# Patient Record
Sex: Female | Born: 1945
Health system: Southern US, Community
[De-identification: ages and names within clinical notes are randomized; demographics above are authoritative.]

## PROBLEM LIST (undated history)

## (undated) DIAGNOSIS — K2900 Acute gastritis without bleeding: Secondary | ICD-10-CM

## (undated) DIAGNOSIS — M1990 Unspecified osteoarthritis, unspecified site: Secondary | ICD-10-CM

## (undated) DIAGNOSIS — K648 Other hemorrhoids: Secondary | ICD-10-CM

## (undated) DIAGNOSIS — H409 Unspecified glaucoma: Secondary | ICD-10-CM

## (undated) DIAGNOSIS — K5730 Diverticulosis of large intestine without perforation or abscess without bleeding: Secondary | ICD-10-CM

## (undated) HISTORY — DX: Other hemorrhoids: K64.8

## (undated) HISTORY — PX: OTHER SURGICAL HISTORY: SHX169

## (undated) HISTORY — DX: Unspecified glaucoma: H40.9

## (undated) HISTORY — DX: Diverticulosis of large intestine without perforation or abscess without bleeding: K57.30

## (undated) HISTORY — PX: APPENDECTOMY: SHX54

## (undated) HISTORY — DX: Acute gastritis without bleeding: K29.00

## (undated) HISTORY — DX: Unspecified osteoarthritis, unspecified site: M19.90

---

## 2000-11-21 HISTORY — PX: COLONOSCOPY: SHX174

## 2000-11-21 HISTORY — PX: ESOPHAGOGASTRODUODENOSCOPY: SHX1529

## 2001-05-01 ENCOUNTER — Ambulatory Visit (HOSPITAL_COMMUNITY): Admission: RE | Admit: 2001-05-01 | Discharge: 2001-05-01 | Payer: Self-pay | Admitting: Internal Medicine

## 2001-05-01 ENCOUNTER — Encounter: Payer: Self-pay | Admitting: Internal Medicine

## 2001-05-11 ENCOUNTER — Encounter: Payer: Self-pay | Admitting: Internal Medicine

## 2001-05-11 ENCOUNTER — Ambulatory Visit (HOSPITAL_COMMUNITY): Admission: RE | Admit: 2001-05-11 | Discharge: 2001-05-11 | Payer: Self-pay | Admitting: Internal Medicine

## 2001-05-14 ENCOUNTER — Ambulatory Visit (HOSPITAL_COMMUNITY): Admission: RE | Admit: 2001-05-14 | Discharge: 2001-05-14 | Payer: Self-pay | Admitting: Internal Medicine

## 2001-05-14 ENCOUNTER — Encounter: Payer: Self-pay | Admitting: Internal Medicine

## 2001-05-19 ENCOUNTER — Encounter: Payer: Self-pay | Admitting: Internal Medicine

## 2001-05-19 ENCOUNTER — Encounter: Admission: RE | Admit: 2001-05-19 | Discharge: 2001-05-19 | Payer: Self-pay | Admitting: Internal Medicine

## 2001-05-28 ENCOUNTER — Encounter: Payer: Self-pay | Admitting: Internal Medicine

## 2001-05-28 ENCOUNTER — Ambulatory Visit (HOSPITAL_COMMUNITY): Admission: RE | Admit: 2001-05-28 | Discharge: 2001-05-28 | Payer: Self-pay | Admitting: Internal Medicine

## 2005-04-27 ENCOUNTER — Other Ambulatory Visit: Admission: RE | Admit: 2005-04-27 | Discharge: 2005-04-27 | Payer: Self-pay | Admitting: Obstetrics and Gynecology

## 2005-12-13 ENCOUNTER — Encounter: Admission: RE | Admit: 2005-12-13 | Discharge: 2005-12-13 | Payer: Self-pay | Admitting: Internal Medicine

## 2006-08-15 ENCOUNTER — Ambulatory Visit (HOSPITAL_COMMUNITY): Admission: RE | Admit: 2006-08-15 | Discharge: 2006-08-16 | Payer: Self-pay | Admitting: Orthopedic Surgery
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
## 2007-10-06 IMAGING — CR DG WRIST 2V*L*
1 series · 1 of 1 positions shown · non-contrast
Comparison: none

CLINICAL DATA: Fractured wrist.  Postop.
 LEFT WRIST - 2 VIEW:

[view not recorded]
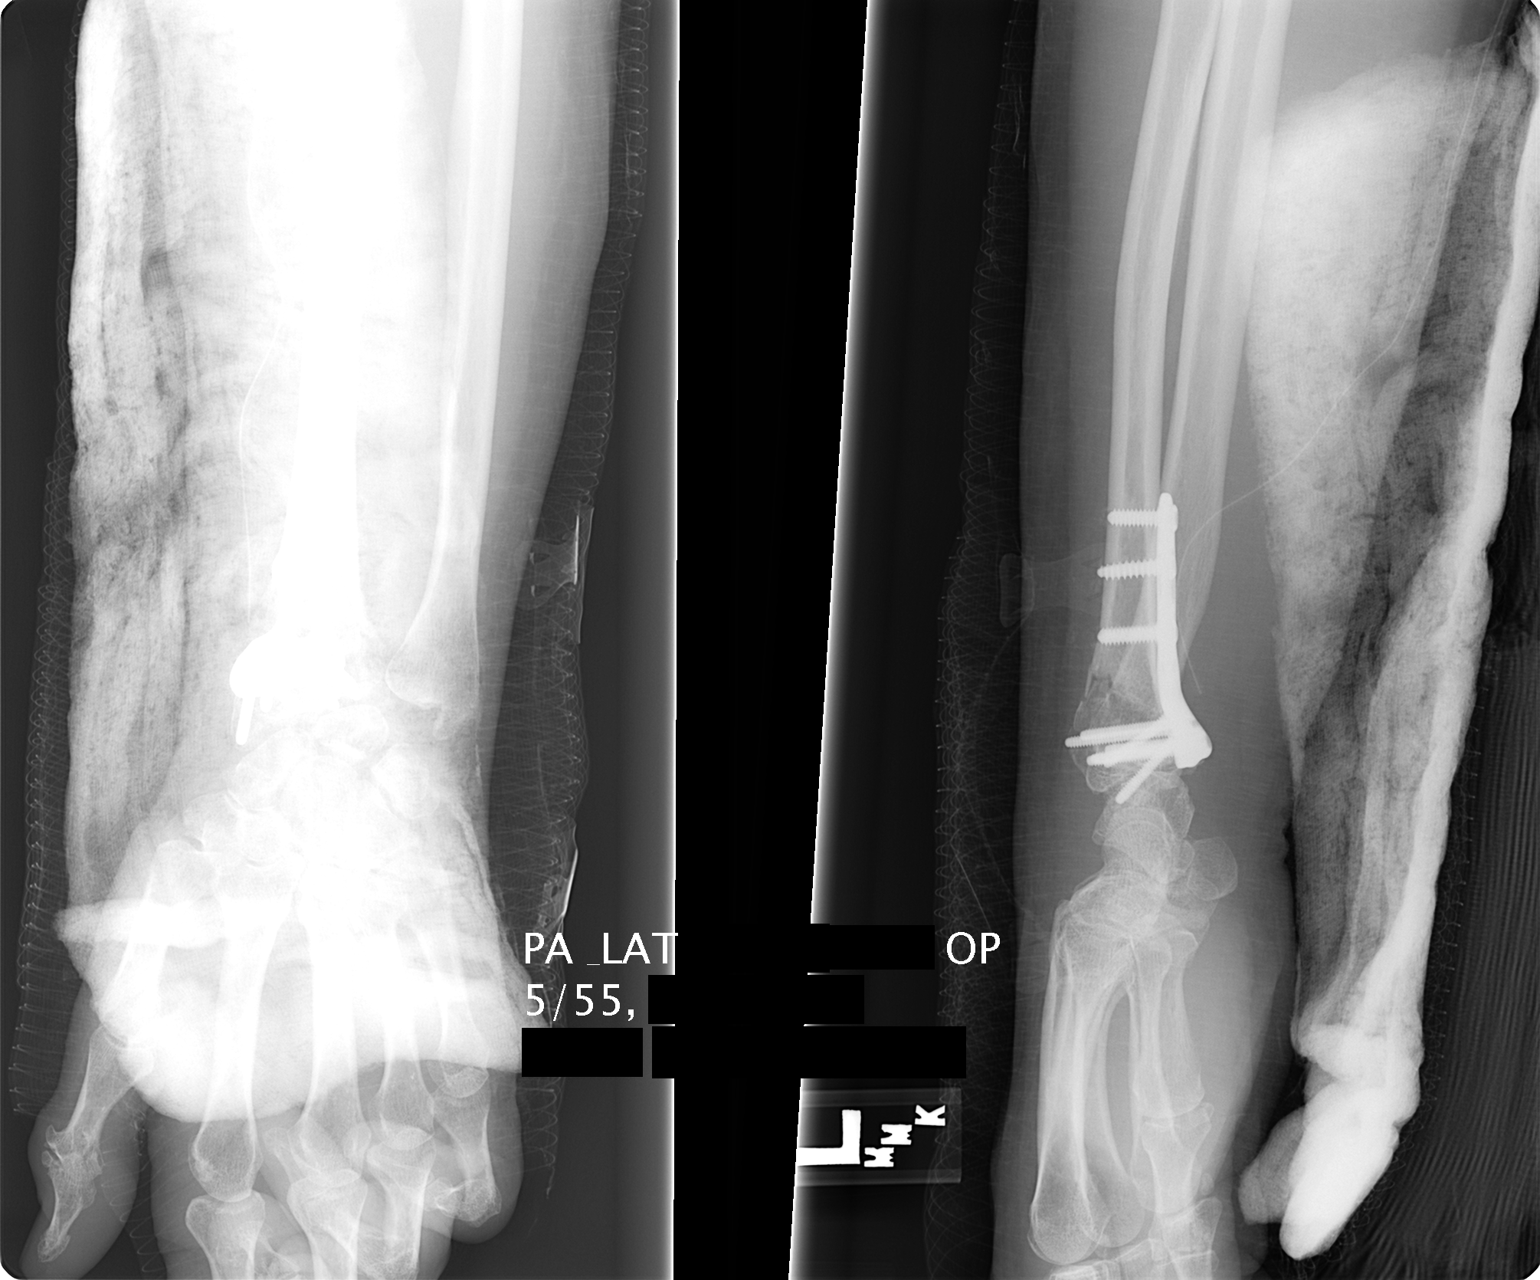

[1 of 1 positions shown; findings below may reference images not displayed]

FINDINGS: Two views of the left wrist show plate and screw fixation of the distal left radial fracture in good position and alignment.
IMPRESSION: Fixation of distal left radial fracture.

## 2010-12-12 ENCOUNTER — Encounter: Payer: Self-pay | Admitting: Obstetrics and Gynecology

## 2011-06-22 ENCOUNTER — Encounter: Payer: Self-pay | Admitting: Internal Medicine

## 2012-08-21 ENCOUNTER — Encounter: Payer: Self-pay | Admitting: Internal Medicine

## 2014-03-24 ENCOUNTER — Encounter: Payer: Self-pay | Admitting: *Deleted

## 2014-03-24 ENCOUNTER — Other Ambulatory Visit: Payer: Self-pay | Admitting: Internal Medicine

## 2014-03-26 ENCOUNTER — Encounter: Payer: Self-pay | Admitting: Nurse Practitioner

## 2014-03-26 ENCOUNTER — Ambulatory Visit (INDEPENDENT_AMBULATORY_CARE_PROVIDER_SITE_OTHER): Payer: Medicare HMO | Admitting: Nurse Practitioner

## 2014-03-26 VITALS — BP 122/80 | HR 72 | Ht 59.5 in | Wt 137.4 lb

## 2014-03-26 DIAGNOSIS — R634 Abnormal weight loss: Secondary | ICD-10-CM

## 2014-03-26 DIAGNOSIS — R110 Nausea: Secondary | ICD-10-CM

## 2014-03-26 DIAGNOSIS — Z1211 Encounter for screening for malignant neoplasm of colon: Secondary | ICD-10-CM

## 2014-03-26 DIAGNOSIS — R112 Nausea with vomiting, unspecified: Secondary | ICD-10-CM

## 2014-03-26 DIAGNOSIS — R1011 Right upper quadrant pain: Secondary | ICD-10-CM

## 2014-03-26 DIAGNOSIS — R109 Unspecified abdominal pain: Secondary | ICD-10-CM

## 2014-03-26 MED ORDER — MOVIPREP 100 G PO SOLR: 1.0000 | Freq: Once | ORAL | Status: DC

## 2014-03-26 NOTE — Progress Notes (Signed)
    HPI :  Patient is a 68 year old female known remotely to Dr. Marina GoodellPerry. She had a colonoscopy in 2002 for evaluation of abdominal pain/bloating and weight loss. Findings included internal hemorrhoids and diverticulosis. Patient is overdue for repeat screening colonoscopy which she comes today to discuss.   In addition to above,  patient is here for evaluation of RUQ pain which started in December. The pain radiates around to her right mid back and is associated with intermittent nausea and vomiting. Pain is worse after meals, better when up and moving around. Lying down, especially on lying on right side exacerbates the pain. Patient was started on Prilosec 2 weeks ago, so far no improvement. She has had an unintentional weight loss of 10 pounds since December. Recent labs by PCP were unremarkable. WBC 10.8, hemoglobin 13.1, LFTs normal. She had an abdominal ultrasound which was also normal. Yesterday a CT scan of the abdomen and pelvis with contrast was unremarkable as well. BMs normal. Patient does take 3 Ibuprofen tablets about twice a week.  Past Medical History  Diagnosis Date  . Diverticulosis of colon (without mention of hemorrhage)   . Internal hemorrhoids without mention of complication   . Acute gastritis without mention of hemorrhage   . Glaucoma    FMH: pancreatic cancer, colon polyps (mother) and diabetes  History  Substance Use Topics  . Smoking status: Never Smoker   . Smokeless tobacco: Not on file  . Alcohol Use: No   Current Outpatient Prescriptions  Medication Sig Dispense Refill  . ketotifen (ZADITOR) 0.025 % ophthalmic solution Place 1 drop into both eyes 2 (two) times daily.       No current facility-administered medications for this visit.   Allergies not on file Review of Systems: Positive for night sweats and urine leakage. All other systems reviewed and negative except where noted in HPI.   Physical Exam: BP 122/80  Pulse 72  Ht 4' 11.5" (1.511 m)  Wt 137  lb 6.4 oz (62.324 kg)  BMI 27.30 kg/m2 Constitutional: Pleasant,well-developed, white female in no acute distress. HEENT: Normocephalic and atraumatic. Conjunctivae are normal. No scleral icterus. Neck supple.  Cardiovascular: Normal rate, regular rhythm.  Pulmonary/chest: Effort normal and breath sounds normal. No wheezing, rales or rhonchi. Abdominal: Soft, nondistended, nontender. Bowel sounds active throughout. There are no masses palpable. No hepatomegaly. Extremities: no edema Lymphadenopathy: No cervical adenopathy noted. Neurological: Alert and oriented to person place and time. Skin: Skin is warm and dry. No rashes noted. Psychiatric: Normal mood and affect. Behavior is normal.   ASSESSMENT AND PLAN:  281. 68 year old female with several month history of progressive RUQ pain associated with nausea, vomiting and weight loss. Pain worse with meals. Labs, U/S and CTscan non-diagnostic. She does take NSAIDS so PUD is one possibility. For further evaluation patient will have EGD at time of screening colonoscopy. The benefits, risks, and potential complications of EGD with possible biopsies were discussed with the patient and she agrees to proceed.   2. Colon cancer screening. Last colonoscopy was 2002. The risks, benefits, and alternatives to colonoscopy with possible biopsy and possible polypectomy were discussed with the patient and she consents to proceed.

## 2014-03-26 NOTE — Patient Instructions (Signed)

## 2014-03-26 NOTE — Progress Notes (Signed)
Reviewed and Agree.

## 2014-03-31 ENCOUNTER — Encounter: Payer: Self-pay | Admitting: Internal Medicine

## 2014-04-11 ENCOUNTER — Encounter: Payer: Self-pay | Admitting: Internal Medicine

## 2014-05-01 ENCOUNTER — Encounter: Payer: Self-pay | Admitting: Internal Medicine

## 2014-05-01 ENCOUNTER — Ambulatory Visit (AMBULATORY_SURGERY_CENTER): Payer: Medicare HMO | Admitting: Internal Medicine

## 2014-05-01 VITALS — BP 126/75 | HR 60 | Temp 98.1°F | Resp 14 | Ht 59.0 in | Wt 137.0 lb

## 2014-05-01 DIAGNOSIS — R109 Unspecified abdominal pain: Secondary | ICD-10-CM

## 2014-05-01 DIAGNOSIS — R1011 Right upper quadrant pain: Secondary | ICD-10-CM

## 2014-05-01 DIAGNOSIS — Z1211 Encounter for screening for malignant neoplasm of colon: Secondary | ICD-10-CM

## 2014-05-01 MED ORDER — SODIUM CHLORIDE 0.9 % IV SOLN: 500.0000 mL | INTRAVENOUS | Status: DC

## 2014-05-01 NOTE — Op Note (Signed)
[facility] [address] [place], [address]   ENDOSCOPY PROCEDURE REPORT  PATIENT: Cheyenne Clark, Cheyenne Clark  MR#: [id number] BIRTHDATE: 02-17-1946 , 67  yrs. old GENDER: Female ENDOSCOPIST: Roxy Cedar, MD REFERRED BY:  Geoffry Paradise, M.D. PROCEDURE DATE:  05/01/2014 PROCEDURE:  EGD, diagnostic ASA CLASS:     Class II INDICATIONS:  abdominal pain in the upper right quadrant. MEDICATIONS: MAC sedation, administered by CRNA and propofol (Diprivan) 30mg  IV TOPICAL ANESTHETIC: none  DESCRIPTION OF PROCEDURE: After the risks benefits and alternatives of the procedure were thoroughly explained, informed consent was obtained.  The LB [id number] endoscope was introduced through the mouth and advanced to the second portion of the duodenum. Without limitations.  The instrument was slowly withdrawn as the mucosa was fully examined.     EXAM:The upper, middle and distal third of the esophagus were carefully inspected and no abnormalities were noted.  The z-line was well seen at the GEJ.  The endoscope was pushed into the fundus which was normal including a retroflexed view.  The antrum, gastric body, first and second part of the duodenum were unremarkable. Retroflexed views revealed no abnormalities.     The scope was then withdrawn from the patient and the procedure completed.  COMPLICATIONS: There were no complications. ENDOSCOPIC IMPRESSION: 1. Normal EGD 2. No GI cause for her upper quadrant pain found or suspected. May be musculoskeletal  RECOMMENDATIONS: 1. GI OP follow-up is advised on a PRN basis.. Return to the care of Dr. Jacky Kindle  REPEAT EXAM:  eSigned:  Roxy Cedar, MD 05/01/2014 4:01 PM   [id number] Jacky Kindle, MD and The Patient

## 2014-05-01 NOTE — Op Note (Signed)
[facility] [address] [place], [address]   COLONOSCOPY PROCEDURE REPORT  PATIENT: Cheyenne Clark, Cheyenne Clark  MR#: [id number] BIRTHDATE: 11/15/1946 , 67  yrs. old GENDER: Female ENDOSCOPIST: Roxy Cedar, MD REFERRED [id number] Jacky Kindle, M.D. PROCEDURE DATE:  05/01/2014 PROCEDURE:   Colonoscopy, screening First Screening Colonoscopy - Avg.  risk and is 50 yrs.  old or older - No.  Prior Negative Screening - Now for repeat screening. 10 or more years since last screening  History of Adenoma - Now for follow-up colonoscopy & has been > or = to 3 yrs.  N/A  Polyps Removed Today? No.  Recommend repeat exam, <10 yrs? No. ASA CLASS:   Class II INDICATIONS:average risk screening.. Normal index exam 2002 MEDICATIONS: MAC sedation, administered by CRNA and propofol (Diprivan) 350mg  IV  DESCRIPTION OF PROCEDURE:   After the risks benefits and alternatives of the procedure were thoroughly explained, informed consent was obtained.  A digital rectal exam revealed no abnormalities of the rectum.   The LB [id number]  endoscope was introduced through the anus and advanced to the cecum, which was identified by both the appendix and ileocecal valve. No adverse events experienced.   The quality of the prep was excellent, using MoviPrep  The instrument was then slowly withdrawn as the colon was fully examined.  COLON FINDINGS: The mucosa appeared normal in the terminal ileum. Mild diverticulosis was noted in the sigmoid colon.   The colon mucosa was otherwise normal.  Retroflexed views revealed internal hemorrhoids. The time to cecum=4 minutes 34 seconds.  Withdrawal time=11 minutes 07 seconds.  The scope was withdrawn and the procedure completed. COMPLICATIONS: There were no complications.  ENDOSCOPIC IMPRESSION: 1.   Normal mucosa in the terminal ileum 2.   Mild diverticulosis was noted in the sigmoid colon 3.   The colon mucosa was otherwise  normal  RECOMMENDATIONS: 1.  Continue current colorectal screening recommendations for "routine risk" patients with a repeat colonoscopy in 10 years. 2.  Upper endoscopy today (see report)   eSigned:  Roxy Cedar, MD 05/01/2014 3:59 PM   cc: Geoffry Paradise, MD and The Patient

## 2014-05-01 NOTE — Patient Instructions (Signed)
YOU HAD AN ENDOSCOPIC PROCEDURE TODAY AT THE [facility]: Refer to the procedure report that was given to you for any specific questions about what was found during the examination.  If the procedure report does not answer your questions, please call your gastroenterologist to clarify.  If you requested that your care partner not be given the details of your procedure findings, then the procedure report has been included in a sealed envelope for you to review at your convenience later.  YOU SHOULD EXPECT: Some feelings of bloating in the abdomen. Passage of more gas than usual.  Walking can help get rid of the air that was put into your GI tract during the procedure and reduce the bloating. If you had a lower endoscopy (such as a colonoscopy or flexible sigmoidoscopy) you may notice spotting of blood in your stool or on the toilet paper. If you underwent a bowel prep for your procedure, then you may not have a normal bowel movement for a few days.  DIET: Your first meal following the procedure should be a light meal and then it is ok to progress to your normal diet.  A half-sandwich or bowl of soup is an example of a good first meal.  Heavy or fried foods are harder to digest and may make you feel nauseous or bloated.  Likewise meals heavy in dairy and vegetables can cause extra gas to form and this can also increase the bloating.  Drink plenty of fluids but you should avoid alcoholic beverages for 24 hours.  ACTIVITY: Your care partner should take you home directly after the procedure.  You should plan to take it easy, moving slowly for the rest of the day.  You can resume normal activity the day after the procedure however you should NOT DRIVE or use heavy machinery for 24 hours (because of the sedation medicines used during the test).    SYMPTOMS TO REPORT IMMEDIATELY: A gastroenterologist can be reached at any hour.  During normal business hours, 8:30 AM to 5:00 PM Monday through Friday,  call [phone].  After hours and on weekends, please call the GI answering service at [phone] who will take a message and have the physician on call contact you.   Following lower endoscopy (colonoscopy or flexible sigmoidoscopy):  Excessive amounts of blood in the stool  Significant tenderness or worsening of abdominal pains  Swelling of the abdomen that is new, acute  Fever of 100F or higher  Following upper endoscopy (EGD)  Vomiting of blood or coffee ground material  New chest pain or pain under the shoulder blades  Painful or persistently difficult swallowing  New shortness of breath  Fever of 100F or higher  Black, tarry-looking stools  FOLLOW UP: If any biopsies were taken you will be contacted by phone or by letter within the next 1-3 weeks.  Call your gastroenterologist if you have not heard about the biopsies in 3 weeks.  Our staff will call the home number listed on your records the next business day following your procedure to check on you and address any questions or concerns that you may have at that time regarding the information given to you following your procedure. This is a courtesy call and so if there is no answer at the home number and we have not heard from you through the emergency physician on call, we will assume that you have returned to your regular daily activities without incident.  SIGNATURES/CONFIDENTIALITY: You and/or your care   partner have signed paperwork which will be entered into your electronic medical record.  These signatures attest to the fact that that the information above on your After Visit Summary has been reviewed and is understood.  Full responsibility of the confidentiality of this discharge information lies with you and/or your care-partner.  

## 2014-05-01 NOTE — Progress Notes (Signed)
Report to PACU, RN, vss, BBS= Clear.  

## 2014-05-02 ENCOUNTER — Telehealth: Payer: Self-pay

## 2014-05-02 NOTE — Telephone Encounter (Signed)
  Follow up Call-  Call back number 05/01/2014  Post procedure Call Back phone  # [phone]  Permission to leave phone message Yes     Patient questions:  Do you have a fever, pain , or abdominal swelling? no Pain Score  0 *  Have you tolerated food without any problems? yes  Have you been able to return to your normal activities? yes  Do you have any questions about your discharge instructions: Diet   no Medications  no Follow up visit  no  Do you have questions or concerns about your Care? no  Actions: * If pain score is 4 or above: No action needed, pain <4.

## 2014-05-09 ENCOUNTER — Other Ambulatory Visit (HOSPITAL_COMMUNITY): Payer: Self-pay | Admitting: Internal Medicine

## 2014-05-09 DIAGNOSIS — R110 Nausea: Secondary | ICD-10-CM

## 2014-05-09 DIAGNOSIS — R1011 Right upper quadrant pain: Secondary | ICD-10-CM

## 2014-05-12 ENCOUNTER — Ambulatory Visit (HOSPITAL_COMMUNITY)
Admission: RE | Admit: 2014-05-12 | Discharge: 2014-05-12 | Disposition: A | Discharge status: Home / Self Care | Payer: Medicare HMO | Source: Ambulatory Visit | Attending: Internal Medicine | Admitting: Internal Medicine

## 2014-05-12 DIAGNOSIS — R110 Nausea: Secondary | ICD-10-CM

## 2014-05-12 DIAGNOSIS — R1011 Right upper quadrant pain: Secondary | ICD-10-CM

## 2014-05-12 DIAGNOSIS — R112 Nausea with vomiting, unspecified: Secondary | ICD-10-CM | POA: Insufficient documentation

## 2014-05-12 DIAGNOSIS — M549 Dorsalgia, unspecified: Secondary | ICD-10-CM | POA: Insufficient documentation

## 2014-05-12 MED ORDER — TECHNETIUM TC 99M MEBROFENIN IV KIT
5.5000 | PACK | Freq: Once | INTRAVENOUS | Status: AC | PRN
  Administered 2014-05-12: 6 via INTRAVENOUS

## 2014-06-11 ENCOUNTER — Telehealth (INDEPENDENT_AMBULATORY_CARE_PROVIDER_SITE_OTHER): Payer: Self-pay

## 2014-06-11 NOTE — Telephone Encounter (Signed)
Per Dr Ardine EngGerkin's request appt made for next week ov to eval gb. LMOM for pt to call and confirm to arrive at 11:30am on 7-29.

## 2014-06-18 ENCOUNTER — Encounter (INDEPENDENT_AMBULATORY_CARE_PROVIDER_SITE_OTHER): Payer: Self-pay | Admitting: Surgery

## 2014-06-18 ENCOUNTER — Ambulatory Visit: Payer: Medicare HMO | Admitting: Internal Medicine

## 2014-06-18 ENCOUNTER — Ambulatory Visit (INDEPENDENT_AMBULATORY_CARE_PROVIDER_SITE_OTHER): Payer: Medicare HMO | Admitting: Surgery

## 2014-06-18 VITALS — BP 132/86 | HR 60 | Temp 98.9°F | Resp 12 | Ht 59.0 in | Wt 129.4 lb

## 2014-06-18 DIAGNOSIS — R1011 Right upper quadrant pain: Secondary | ICD-10-CM

## 2014-06-18 DIAGNOSIS — R1115 Cyclical vomiting syndrome unrelated to migraine: Secondary | ICD-10-CM

## 2014-06-18 NOTE — Progress Notes (Signed)
General Surgery [facility]  Chief Complaint  Patient presents with  . New Evaluation    abdominal pain - referral from Dr. Geoffry Paradise    HISTORY: Patient is a 68 year old female referred by her primary care physician for evaluation of right upper quadrant abdominal pain radiating to the back associated with nausea. Patient has been symptomatic since December 2014. Symptoms markedly intensified in March of 2015. She has persistent nausea. Right upper quadrant abdominal pain radiating to the back is worse at night. She has rare episodes of emesis. Patient denies fevers or chills. She denies jaundice or acholic stools.  Family history is notable for cholecystectomy in the patient's mother.  Only prior abdominal surgery his appendectomy performed in the 1960s.  Patient has had an extensive evaluation including abdominal ultrasound which was normal. Abdominal CT scan which was normal. Nuclear medicine hepatobiliary scan which showed a normal ejection fraction but the patient did experience symptoms with administration of oral Ensure to drink.  Patient has been evaluated by Dr. Yancey Flemings from gastroenterology and has a normal EGD and a normal colonoscopy.  Past Medical History  Diagnosis Date  . Diverticulosis of colon (without mention of hemorrhage)   . Internal hemorrhoids without mention of complication   . Acute gastritis without mention of hemorrhage   . Glaucoma   . Arthritis     Current Outpatient Prescriptions  Medication Sig Dispense Refill  . latanoprost (XALATAN) 0.005 % ophthalmic solution       . omeprazole (PRILOSEC) 40 MG capsule        No current facility-administered medications for this visit.    No Known Allergies  Family History  Problem Relation Age of Onset  . Colon cancer Neg Hx   . Pancreatic cancer Father   . Cancer Father     pancreatic  . Melanoma Maternal Grandfather   . Leukemia Paternal Grandfather     History    Social History  . Marital Status: Married    Spouse Name: N/A    Number of Children: N/A  . Years of Education: N/A   Social History Main Topics  . Smoking status: Never Smoker   . Smokeless tobacco: Never Used  . Alcohol Use: No  . Drug Use: No  . Sexual Activity: None   Other Topics Concern  . None   Social History Narrative  . None    REVIEW OF SYSTEMS - PERTINENT POSITIVES ONLY: Right upper quadrant abdominal pain radiating to the back, persistent nausea, weight loss of 20 pounds.  EXAM: Filed Vitals:   06/18/14 1130  BP: 132/86  Pulse: 60  Temp: 98.9 F (37.2 C)  Resp: 12    GENERAL: well-developed, well-nourished, no acute distress HEENT: normocephalic; pupils equal and reactive; sclerae clear; dentition good; mucous membranes moist NECK:  No palpable masses in the thyroid bed; symmetric on extension; no palpable anterior or posterior cervical lymphadenopathy; no supraclavicular masses; no tenderness CHEST: clear to auscultation bilaterally without rales, rhonchi, or wheezes CARDIAC: regular rate and rhythm without significant murmur; peripheral pulses are full ABDOMEN: soft without distension; bowel sounds present; no mass; no hepatosplenomegaly; no hernia EXT:  non-tender without edema; no deformity NEURO: no gross focal deficits; no sign of tremor   LABORATORY RESULTS: See [facility] (CHL-Epic) for most recent results  RADIOLOGY RESULTS: See [facility] (CHL-Epic) for most recent results  IMPRESSION: Right upper quadrant abdominal pain with nausea of undetermined etiology  PLAN: I had a lengthy discussion today  with the patient and her husband. We reviewed all of the above studies. I provided him with written literature on cholecystectomy to review at home.  After careful consideration of the patient's clinical course, I have recommended laparoscopic cholecystectomy with intraoperative cholangiography. We have discussed the procedure, the  potential complications, the hospital stay to be anticipated, and her recovery. I believe there is approximately a 60-70% chance of symptomatic improvement. Patient and her husband understand and wish to proceed with cholecystectomy in the near future.  The risks and benefits of the procedure have been discussed at length with the patient.  The patient understands the proposed procedure, potential alternative treatments, and the course of recovery to be expected.  All of the patient's questions have been answered at this time.  The patient wishes to proceed with surgery.  Velora Hecklerodd M. Claryssa Sandner, MD, FACS General & Endocrine Surgery [facility]  Primary Care Physician: Minda MeoARONSON,RICHARD A, MD

## 2014-06-18 NOTE — Patient Instructions (Signed)
  [facility]  LAPAROSCOPIC SURGERY - POST-OP INSTRUCTIONS  Always review your discharge instruction sheet given to you by the facility where your surgery was performed.  A prescription for pain medication may be given to you upon discharge.  Take your pain medication as prescribed.  If narcotic pain medicine is not needed, then you may take acetaminophen (Tylenol) or ibuprofen (Advil) as needed.  Take your usually prescribed medications unless otherwise directed.  If you need a refill on your pain medication, please contact your pharmacy.  They will contact our office to request authorization. Prescriptions will not be filled after 5 P.M. or on weekends.  You should follow a light diet the first few days after arrival home, such as soup and crackers or toast.  Be sure to include plenty of fluids daily.  Most patients will experience some swelling and bruising in the area of the incisions.  Ice packs will help.  Swelling and bruising can take several days to resolve.   It is common to experience some constipation if taking pain medication after surgery.  Increasing fluid intake and taking a stool softener (such as Colace) will usually help or prevent this problem from occurring.  A mild laxative (Milk of Magnesia or Miralax) should be taken according to package instructions if there are no bowel movements after 48 hours.  Unless discharge instructions indicate otherwise, you may remove your bandages 24-48 hours after surgery, and you may shower at that time.  You may have steri-strips (small skin tapes) in place directly over the incision.  These strips should be left on the skin for 7-10 days.  If your surgeon used skin glue on the incision, you may shower in 24 hours.  The glue will flake off over the next 2-3 weeks.  Any sutures or staples will be removed at the office during your follow-up visit.  ACTIVITIES:  You may resume regular (light) daily activities beginning the  next day-such as daily self-care, walking, climbing stairs-gradually increasing activities as tolerated.  You may have sexual intercourse when it is comfortable.  Refrain from any heavy lifting or straining until approved by your doctor.  You may drive when you are no longer taking prescription pain medication, you can comfortably wear a seatbelt, and you can safely maneuver your car and apply brakes.  You should see your doctor in the office for a follow-up appointment approximately 2-3 weeks after your surgery.  Make sure that you call for this appointment within a day or two after you arrive home to insure a convenient appointment time.  WHEN TO CALL YOUR DOCTOR: 1. Fever over 101.0 2. Inability to urinate 3. Continued bleeding from incision 4. Increased pain, redness, or drainage from the incision 5. Increasing abdominal pain  The clinic staff is available to answer your questions during regular business hours.  Please don't hesitate to call and ask to speak to one of the nurses for clinical concerns.  If you have a medical emergency, go to the nearest emergency room or call 911.  A surgeon from [facility] is always on call for the hospital.  Jamiya Nims M. Marylynn Rigdon, MD, FACS [facility] Office: [phone] Toll Free:  [phone] FAX [phone]  Web site: [email or web address] 

## 2014-07-03 ENCOUNTER — Encounter (HOSPITAL_COMMUNITY): Payer: Self-pay | Admitting: Pharmacy Technician

## 2014-07-07 NOTE — Patient Instructions (Signed)
Cheyenne SartoriusLinda E Clark  07/07/2014   Your procedure is scheduled on: 07/11/2014     Report to [facility] Main [facility]tr[facility].  Follow the Signs to Short Stay Center at    0545     am  Call this number if you have problems the morning of surgery: [phone]   Remember:   Do not eat food or drink liquids after midnight.   Take these medicines the morning of surgery with A SIP OF WATER:    Do not wear jewelry, make-up or nail polish.  Do not wear lotions, powders, or perfumes. , deodorant   Do not shave 48 hours prior to surgery.   Do not bring valuables to the hospital.  Contacts, dentures or bridgework may not be worn into surgery.  Leave suitcase in the car. After surgery it may be brought to your room.  For patients admitted to the hospital, checkout time is 11:00 AM the day of  discharge.         Please read over the following fact sheets that you were given: [facility] - Preparing for Surgery Before surgery, you can play an important role.  Because skin is not sterile, your skin needs to be as free of germs as possible.  You can reduce the number of germs on your skin by washing with CHG (chlorahexidine gluconate) soap before surgery.  CHG is an antiseptic cleaner which kills germs and bonds with the skin to continue killing germs even after washing. Please DO NOT use if you have an allergy to CHG or antibacterial soaps.  If your skin becomes reddened/irritated stop using the CHG and inform your nurse when you arrive at Short Stay. Do not shave (including legs and underarms) for at least 48 hours prior to the first CHG shower.  You may shave your face/neck. Please follow these instructions carefully:  1.  Shower with CHG Soap the night before surgery and the  morning of Surgery.  2.  If you choose to wash your hair, wash your hair first as usual with your  normal  shampoo.  3.  After you shampoo, rinse your hair and body thoroughly to remove the  shampoo.                           4.  Use CHG as  you would any other liquid soap.  You can apply chg directly  to the skin and wash                       Gently with a scrungie or clean washcloth.  5.  Apply the CHG Soap to your body ONLY FROM THE NECK DOWN.   Do not use on face/ open                           Wound or open sores. Avoid contact with eyes, ears mouth and genitals (private parts).                       Wash face,  Genitals (private parts) with your normal soap.             6.  Wash thoroughly, paying special attention to the area where your surgery  will be performed.  7.  Thoroughly rinse your body with warm water from the neck down.  8.  DO NOT shower/wash with your  normal soap after using and rinsing off  the CHG Soap.                9.  Pat yourself dry with a clean towel.            10.  Wear clean pajamas.            11.  Place clean sheets on your bed the night of your first shower and do not  sleep with pets. Day of Surgery : Do not apply any lotions/deodorants the morning of surgery.  Please wear clean clothes to the hospital/surgery center.  FAILURE TO FOLLOW THESE INSTRUCTIONS MAY RESULT IN THE CANCELLATION OF YOUR SURGERY PATIENT SIGNATURE_________________________________  NURSE SIGNATURE__________________________________  ________________________________________________________________________ , coughing and deep breathing exercises, leg exercises

## 2014-07-08 ENCOUNTER — Encounter (HOSPITAL_COMMUNITY)
Admission: RE | Admit: 2014-07-08 | Discharge: 2014-07-08 | Disposition: A | Discharge status: Home / Self Care | Payer: Medicare HMO | Source: Ambulatory Visit | Attending: Surgery | Admitting: Surgery

## 2014-07-08 ENCOUNTER — Encounter (HOSPITAL_COMMUNITY): Payer: Self-pay

## 2014-07-08 DIAGNOSIS — R1011 Right upper quadrant pain: Secondary | ICD-10-CM | POA: Diagnosis present

## 2014-07-08 DIAGNOSIS — Z79899 Other long term (current) drug therapy: Secondary | ICD-10-CM | POA: Diagnosis not present

## 2014-07-08 DIAGNOSIS — K8010 Calculus of gallbladder with chronic cholecystitis without obstruction: Secondary | ICD-10-CM | POA: Diagnosis not present

## 2014-07-08 LAB — CBC
HCT: 37.1 % (ref 36.0–46.0)
Hemoglobin: 12.7 g/dL (ref 12.0–15.0)
MCH: 32 pg (ref 26.0–34.0)
MCHC: 34.2 g/dL (ref 30.0–36.0)
MCV: 93.5 fL (ref 78.0–100.0)
Platelets: 290 10*3/uL (ref 150–400)
RBC: 3.97 MIL/uL (ref 3.87–5.11)
RDW: 12.5 % (ref 11.5–15.5)
WBC: 5.8 10*3/uL (ref 4.0–10.5)

## 2014-07-08 NOTE — Progress Notes (Signed)
Quick Note:  These results are acceptable for scheduled surgery.  Liliana Brentlinger M. Camilla Skeen, MD, FACS [facility] Office: [phone]   ______ 

## 2014-07-11 ENCOUNTER — Ambulatory Visit (HOSPITAL_COMMUNITY): Payer: Medicare HMO | Admitting: Certified Registered Nurse Anesthetist

## 2014-07-11 ENCOUNTER — Encounter (HOSPITAL_COMMUNITY)
Admission: RE | Disposition: A | Discharge status: Home / Self Care | Payer: Self-pay | Source: Ambulatory Visit | Attending: Surgery

## 2014-07-11 ENCOUNTER — Ambulatory Visit (HOSPITAL_COMMUNITY): Payer: Medicare HMO

## 2014-07-11 ENCOUNTER — Encounter (HOSPITAL_COMMUNITY): Payer: Medicare HMO | Admitting: Certified Registered Nurse Anesthetist

## 2014-07-11 ENCOUNTER — Observation Stay (HOSPITAL_COMMUNITY)
Admission: RE | Admit: 2014-07-11 | Discharge: 2014-07-11 | Disposition: A | Discharge status: Home / Self Care | Payer: Medicare HMO | Source: Ambulatory Visit | Attending: Surgery | Admitting: Surgery

## 2014-07-11 ENCOUNTER — Encounter (HOSPITAL_COMMUNITY): Payer: Self-pay | Admitting: *Deleted

## 2014-07-11 DIAGNOSIS — R112 Nausea with vomiting, unspecified: Secondary | ICD-10-CM | POA: Diagnosis present

## 2014-07-11 DIAGNOSIS — R1011 Right upper quadrant pain: Secondary | ICD-10-CM

## 2014-07-11 DIAGNOSIS — K828 Other specified diseases of gallbladder: Secondary | ICD-10-CM | POA: Diagnosis present

## 2014-07-11 DIAGNOSIS — K8010 Calculus of gallbladder with chronic cholecystitis without obstruction: Secondary | ICD-10-CM

## 2014-07-11 DIAGNOSIS — Z79899 Other long term (current) drug therapy: Secondary | ICD-10-CM | POA: Diagnosis not present

## 2014-07-11 HISTORY — PX: CHOLECYSTECTOMY: SHX55

## 2014-07-11 SURGERY — LAPAROSCOPIC CHOLECYSTECTOMY WITH INTRAOPERATIVE CHOLANGIOGRAM
Anesthesia: General | Site: Abdomen

## 2014-07-11 MED ORDER — KCL IN DEXTROSE-NACL 30-5-0.45 MEQ/L-%-% IV SOLN
INTRAVENOUS | Status: DC
  Administered 2014-07-11: 11:00:00 via INTRAVENOUS
  Filled 2014-07-11: qty 1000

## 2014-07-11 MED ORDER — LACTATED RINGERS IR SOLN
Status: DC | PRN
  Administered 2014-07-11: 1000 mL

## 2014-07-11 MED ORDER — LATANOPROST 0.005 % OP SOLN
1.0000 [drp] | Freq: Every day | OPHTHALMIC | Status: DC
  Filled 2014-07-11: qty 2.5

## 2014-07-11 MED ORDER — DEXAMETHASONE SODIUM PHOSPHATE 10 MG/ML IJ SOLN
INTRAMUSCULAR | Status: DC | PRN
  Administered 2014-07-11: 10 mg via INTRAVENOUS

## 2014-07-11 MED ORDER — LACTATED RINGERS IV SOLN
INTRAVENOUS | Status: DC
  Administered 2014-07-11: 10:00:00 via INTRAVENOUS

## 2014-07-11 MED ORDER — MEPERIDINE HCL 50 MG/ML IJ SOLN: 6.2500 mg | INTRAMUSCULAR | Status: DC | PRN

## 2014-07-11 MED ORDER — EPHEDRINE SULFATE 50 MG/ML IJ SOLN
INTRAMUSCULAR | Status: DC | PRN
  Administered 2014-07-11 (×3): 5 mg via INTRAVENOUS

## 2014-07-11 MED ORDER — SUCCINYLCHOLINE CHLORIDE 20 MG/ML IJ SOLN
INTRAMUSCULAR | Status: DC | PRN
  Administered 2014-07-11: 100 mg via INTRAVENOUS

## 2014-07-11 MED ORDER — HYDROCODONE-ACETAMINOPHEN 5-325 MG PO TABS: 1.0000 | ORAL_TABLET | ORAL | Status: AC | PRN

## 2014-07-11 MED ORDER — ONDANSETRON HCL 4 MG/2ML IJ SOLN
INTRAMUSCULAR | Status: AC
Start: 2014-07-11 — End: 2014-07-11
  Filled 2014-07-11: qty 2

## 2014-07-11 MED ORDER — CEFAZOLIN SODIUM-DEXTROSE 2-3 GM-% IV SOLR
2.0000 g | INTRAVENOUS | Status: AC
  Administered 2014-07-11: 2 g via INTRAVENOUS

## 2014-07-11 MED ORDER — METOCLOPRAMIDE HCL 5 MG/ML IJ SOLN
INTRAMUSCULAR | Status: DC | PRN
  Administered 2014-07-11: 5 mg via INTRAVENOUS

## 2014-07-11 MED ORDER — 0.9 % SODIUM CHLORIDE (POUR BTL) OPTIME
TOPICAL | Status: DC | PRN
  Administered 2014-07-11: 1000 mL

## 2014-07-11 MED ORDER — BUPIVACAINE-EPINEPHRINE (PF) 0.25% -1:200000 IJ SOLN
INTRAMUSCULAR | Status: DC | PRN
  Administered 2014-07-11: 20 mL

## 2014-07-11 MED ORDER — ONDANSETRON HCL 4 MG/2ML IJ SOLN
INTRAMUSCULAR | Status: DC | PRN
  Administered 2014-07-11 (×2): 2 mg via INTRAVENOUS

## 2014-07-11 MED ORDER — PHENYLEPHRINE HCL 10 MG/ML IJ SOLN
INTRAMUSCULAR | Status: DC | PRN
  Administered 2014-07-11: 40 ug via INTRAVENOUS

## 2014-07-11 MED ORDER — IOHEXOL 300 MG/ML  SOLN
INTRAMUSCULAR | Status: DC | PRN
  Administered 2014-07-11: 5 mL

## 2014-07-11 MED ORDER — BUPIVACAINE-EPINEPHRINE (PF) 0.25% -1:200000 IJ SOLN
INTRAMUSCULAR | Status: AC
  Filled 2014-07-11: qty 30

## 2014-07-11 MED ORDER — LIDOCAINE HCL (CARDIAC) 20 MG/ML IV SOLN
INTRAVENOUS | Status: AC
  Filled 2014-07-11: qty 5

## 2014-07-11 MED ORDER — GLYCOPYRROLATE 0.2 MG/ML IJ SOLN
INTRAMUSCULAR | Status: AC
  Filled 2014-07-11: qty 3

## 2014-07-11 MED ORDER — LIDOCAINE HCL (CARDIAC) 20 MG/ML IV SOLN
INTRAVENOUS | Status: DC | PRN
  Administered 2014-07-11: 50 mg via INTRAVENOUS

## 2014-07-11 MED ORDER — NEOSTIGMINE METHYLSULFATE 10 MG/10ML IV SOLN
INTRAVENOUS | Status: AC
  Filled 2014-07-11: qty 1

## 2014-07-11 MED ORDER — ACETAMINOPHEN 10 MG/ML IV SOLN
1000.0000 mg | Freq: Once | INTRAVENOUS | Status: AC
  Administered 2014-07-11: 1000 mg via INTRAVENOUS
  Filled 2014-07-11: qty 100

## 2014-07-11 MED ORDER — LACTATED RINGERS IV SOLN
INTRAVENOUS | Status: DC | PRN
  Administered 2014-07-11 (×2): via INTRAVENOUS

## 2014-07-11 MED ORDER — CISATRACURIUM BESYLATE (PF) 10 MG/5ML IV SOLN
INTRAVENOUS | Status: DC | PRN
  Administered 2014-07-11: 6 mg via INTRAVENOUS

## 2014-07-11 MED ORDER — PROPOFOL 10 MG/ML IV BOLUS
INTRAVENOUS | Status: DC | PRN
  Administered 2014-07-11: 175 mg via INTRAVENOUS

## 2014-07-11 MED ORDER — FENTANYL CITRATE 0.05 MG/ML IJ SOLN
INTRAMUSCULAR | Status: AC
  Filled 2014-07-11: qty 2

## 2014-07-11 MED ORDER — SODIUM CHLORIDE 0.9 % IJ SOLN
INTRAMUSCULAR | Status: AC
  Filled 2014-07-11: qty 10

## 2014-07-11 MED ORDER — FENTANYL CITRATE 0.05 MG/ML IJ SOLN
INTRAMUSCULAR | Status: AC
  Filled 2014-07-11: qty 5

## 2014-07-11 MED ORDER — DEXAMETHASONE SODIUM PHOSPHATE 10 MG/ML IJ SOLN
INTRAMUSCULAR | Status: AC
  Filled 2014-07-11: qty 1

## 2014-07-11 MED ORDER — MIDAZOLAM HCL 2 MG/2ML IJ SOLN
INTRAMUSCULAR | Status: AC
  Filled 2014-07-11: qty 2

## 2014-07-11 MED ORDER — PROMETHAZINE HCL 25 MG/ML IJ SOLN: 6.2500 mg | INTRAMUSCULAR | Status: DC | PRN

## 2014-07-11 MED ORDER — FENTANYL CITRATE 0.05 MG/ML IJ SOLN
INTRAMUSCULAR | Status: DC | PRN
  Administered 2014-07-11 (×3): 50 ug via INTRAVENOUS

## 2014-07-11 MED ORDER — EPHEDRINE SULFATE 50 MG/ML IJ SOLN
INTRAMUSCULAR | Status: AC
  Filled 2014-07-11: qty 1

## 2014-07-11 MED ORDER — ONDANSETRON HCL 4 MG/2ML IJ SOLN
4.0000 mg | Freq: Four times a day (QID) | INTRAMUSCULAR | Status: DC | PRN
  Administered 2014-07-11: 4 mg via INTRAVENOUS
  Filled 2014-07-11: qty 2

## 2014-07-11 MED ORDER — NEOSTIGMINE METHYLSULFATE 10 MG/10ML IV SOLN
INTRAVENOUS | Status: DC | PRN
  Administered 2014-07-11: 5 mg via INTRAVENOUS

## 2014-07-11 MED ORDER — FENTANYL CITRATE 0.05 MG/ML IJ SOLN
25.0000 ug | INTRAMUSCULAR | Status: DC | PRN
  Administered 2014-07-11: 50 ug via INTRAVENOUS

## 2014-07-11 MED ORDER — CEFAZOLIN SODIUM-DEXTROSE 2-3 GM-% IV SOLR
INTRAVENOUS | Status: AC
  Filled 2014-07-11: qty 50

## 2014-07-11 MED ORDER — HYDROCODONE-ACETAMINOPHEN 5-325 MG PO TABS: 1.0000 | ORAL_TABLET | ORAL | Status: DC | PRN

## 2014-07-11 MED ORDER — HYDROMORPHONE HCL PF 1 MG/ML IJ SOLN
1.0000 mg | INTRAMUSCULAR | Status: DC | PRN
  Filled 2014-07-11: qty 1

## 2014-07-11 MED ORDER — ATROPINE SULFATE 0.4 MG/ML IJ SOLN
INTRAMUSCULAR | Status: AC
  Filled 2014-07-11: qty 1

## 2014-07-11 MED ORDER — MIDAZOLAM HCL 5 MG/5ML IJ SOLN
INTRAMUSCULAR | Status: DC | PRN
  Administered 2014-07-11 (×2): 1 mg via INTRAVENOUS

## 2014-07-11 MED ORDER — CISATRACURIUM BESYLATE 20 MG/10ML IV SOLN
INTRAVENOUS | Status: AC
  Filled 2014-07-11: qty 10

## 2014-07-11 MED ORDER — ACETAMINOPHEN 325 MG PO TABS
650.0000 mg | ORAL_TABLET | ORAL | Status: DC | PRN
  Administered 2014-07-11: 650 mg via ORAL
  Filled 2014-07-11: qty 2

## 2014-07-11 MED ORDER — PROPOFOL 10 MG/ML IV BOLUS
INTRAVENOUS | Status: AC
  Filled 2014-07-11: qty 20

## 2014-07-11 MED ORDER — GLYCOPYRROLATE 0.2 MG/ML IJ SOLN
INTRAMUSCULAR | Status: DC | PRN
  Administered 2014-07-11: .6 mg via INTRAVENOUS

## 2014-07-11 MED ORDER — ONDANSETRON HCL 4 MG PO TABS: 4.0000 mg | ORAL_TABLET | Freq: Four times a day (QID) | ORAL | Status: DC | PRN

## 2014-07-11 SURGICAL SUPPLY — 39 items
APL SKNCLS STERI-STRIP NONHPOA (GAUZE/BANDAGES/DRESSINGS) ×1
APPLIER CLIP ROT 10 11.4 M/L (STAPLE) ×2
APR CLP MED LRG 11.4X10 (STAPLE) ×1
BAG SPEC RTRVL LRG 6X4 10 (ENDOMECHANICALS) ×1
BENZOIN TINCTURE PRP APPL 2/3 (GAUZE/BANDAGES/DRESSINGS) ×2 IMPLANT
CABLE HIGH FREQUENCY MONO STRZ (ELECTRODE) ×2 IMPLANT
CANISTER SUCTION 2500CC (MISCELLANEOUS) ×1 IMPLANT
CHLORAPREP W/TINT 26ML (MISCELLANEOUS) ×2 IMPLANT
CLIP APPLIE ROT 10 11.4 M/L (STAPLE) ×1 IMPLANT
COVER MAYO STAND STRL (DRAPES) ×2 IMPLANT
DECANTER SPIKE VIAL GLASS SM (MISCELLANEOUS) ×2 IMPLANT
DRAPE C-ARM 42X120 X-RAY (DRAPES) ×2 IMPLANT
DRAPE LAPAROSCOPIC ABDOMINAL (DRAPES) ×2 IMPLANT
DRAPE UTILITY XL STRL (DRAPES) ×2 IMPLANT
ELECT REM PT RETURN 9FT ADLT (ELECTROSURGICAL) ×2
ELECTRODE REM PT RTRN 9FT ADLT (ELECTROSURGICAL) ×1 IMPLANT
GAUZE XEROFORM 2X2 STRL (GAUZE/BANDAGES/DRESSINGS) ×1 IMPLANT
GLOVE SURG ORTHO 8.0 STRL STRW (GLOVE) ×2 IMPLANT
GOWN STRL REUS W/TWL XL LVL3 (GOWN DISPOSABLE) ×4 IMPLANT
HEMOSTAT SURGICEL 4X8 (HEMOSTASIS) IMPLANT
KIT BASIN OR (CUSTOM PROCEDURE TRAY) ×2 IMPLANT
MANIFOLD NEPTUNE II (INSTRUMENTS) ×1 IMPLANT
NS IRRIG 1000ML POUR BTL (IV SOLUTION) ×2 IMPLANT
POUCH SPECIMEN RETRIEVAL 10MM (ENDOMECHANICALS) ×2 IMPLANT
SCISSORS LAP 5X35 DISP (ENDOMECHANICALS) ×2 IMPLANT
SET CHOLANGIOGRAPH MIX (MISCELLANEOUS) ×2 IMPLANT
SET IRRIG TUBING LAPAROSCOPIC (IRRIGATION / IRRIGATOR) ×2 IMPLANT
SLEEVE XCEL OPT CAN 5 100 (ENDOMECHANICALS) ×2 IMPLANT
SOLUTION ANTI FOG 6CC (MISCELLANEOUS) ×2 IMPLANT
STRIP CLOSURE SKIN 1/2X4 (GAUZE/BANDAGES/DRESSINGS) ×2 IMPLANT
SUT MNCRL AB 4-0 PS2 18 (SUTURE) ×2 IMPLANT
TAPE CLOTH SURG 4X10 WHT LF (GAUZE/BANDAGES/DRESSINGS) ×1 IMPLANT
TOWEL OR 17X26 10 PK STRL BLUE (TOWEL DISPOSABLE) ×2 IMPLANT
TOWEL OR NON WOVEN STRL DISP B (DISPOSABLE) ×2 IMPLANT
TRAY LAP CHOLE (CUSTOM PROCEDURE TRAY) ×2 IMPLANT
TROCAR BLADELESS OPT 5 100 (ENDOMECHANICALS) ×2 IMPLANT
TROCAR XCEL BLUNT TIP 100MML (ENDOMECHANICALS) ×2 IMPLANT
TROCAR XCEL NON-BLD 11X100MML (ENDOMECHANICALS) ×2 IMPLANT
TUBING INSUFFLATION 10FT LAP (TUBING) ×2 IMPLANT

## 2014-07-11 NOTE — Anesthesia Postprocedure Evaluation (Signed)
  Anesthesia Post-op Note  Patient: Cheyenne SartoriusLinda E Clark  Procedure(s) Performed: Procedure(s) (LRB): LAPAROSCOPIC CHOLECYSTECTOMY WITH INTRAOPERATIVE CHOLANGIOGRAM (N/A)  Patient Location: PACU  Anesthesia Type: General  Level of Consciousness: awake and alert   Airway and Oxygen Therapy: Patient Spontanous Breathing  Post-op Pain: mild  Post-op Assessment: Post-op Vital signs reviewed, Patient's Cardiovascular Status Stable, Respiratory Function Stable, Patent Airway and No signs of Nausea or vomiting  Last Vitals:  Filed Vitals:   07/11/14 1158  BP: 100/40  Pulse: 55  Temp: 36.4 C  Resp: 16    Post-op Vital Signs: stable   Complications: No apparent anesthesia complications

## 2014-07-11 NOTE — Discharge Instructions (Signed)
  [facility]  LAPAROSCOPIC SURGERY - POST-OP INSTRUCTIONS  Always review your discharge instruction sheet given to you by the facility where your surgery was performed.  A prescription for pain medication may be given to you upon discharge.  Take your pain medication as prescribed.  If narcotic pain medicine is not needed, then you may take acetaminophen (Tylenol) or ibuprofen (Advil) as needed.  Take your usually prescribed medications unless otherwise directed.  If you need a refill on your pain medication, please contact your pharmacy.  They will contact our office to request authorization. Prescriptions will not be filled after 5 P.M. or on weekends.  You should follow a light diet the first few days after arrival home, such as soup and crackers or toast.  Be sure to include plenty of fluids daily.  Most patients will experience some swelling and bruising in the area of the incisions.  Ice packs will help.  Swelling and bruising can take several days to resolve.   It is common to experience some constipation if taking pain medication after surgery.  Increasing fluid intake and taking a stool softener (such as Colace) will usually help or prevent this problem from occurring.  A mild laxative (Milk of Magnesia or Miralax) should be taken according to package instructions if there are no bowel movements after 48 hours.  Unless discharge instructions indicate otherwise, you may remove your bandages 24-48 hours after surgery, and you may shower at that time.  You may have steri-strips (small skin tapes) in place directly over the incision.  These strips should be left on the skin for 7-10 days.  If your surgeon used skin glue on the incision, you may shower in 24 hours.  The glue will flake off over the next 2-3 weeks.  Any sutures or staples will be removed at the office during your follow-up visit.  ACTIVITIES:  You may resume regular (light) daily activities beginning the  next day-such as daily self-care, walking, climbing stairs-gradually increasing activities as tolerated.  You may have sexual intercourse when it is comfortable.  Refrain from any heavy lifting or straining until approved by your doctor.  You may drive when you are no longer taking prescription pain medication, you can comfortably wear a seatbelt, and you can safely maneuver your car and apply brakes.  You should see your doctor in the office for a follow-up appointment approximately 2-3 weeks after your surgery.  Make sure that you call for this appointment within a day or two after you arrive home to insure a convenient appointment time.  WHEN TO CALL YOUR DOCTOR: 1. Fever over 101.0 2. Inability to urinate 3. Continued bleeding from incision 4. Increased pain, redness, or drainage from the incision 5. Increasing abdominal pain  The clinic staff is available to answer your questions during regular business hours.  Please don't hesitate to call and ask to speak to one of the nurses for clinical concerns.  If you have a medical emergency, go to the nearest emergency room or call 911.  A surgeon from [facility] is always on call for the hospital.  Myanna Ziesmer M. Seville Brick, MD, FACS [facility] Office: [phone] Toll Free:  [phone] FAX [phone]  Web site: [email or web address] 

## 2014-07-11 NOTE — H&P (View-Only) (Signed)
General Surgery [facility]  Chief Complaint  Patient presents with  . New Evaluation    abdominal pain - referral from Dr. Geoffry Paradise    HISTORY: Patient is a 68 year old female referred by her primary care physician for evaluation of right upper quadrant abdominal pain radiating to the back associated with nausea. Patient has been symptomatic since December 2014. Symptoms markedly intensified in March of 2015. She has persistent nausea. Right upper quadrant abdominal pain radiating to the back is worse at night. She has rare episodes of emesis. Patient denies fevers or chills. She denies jaundice or acholic stools.  Family history is notable for cholecystectomy in the patient's mother.  Only prior abdominal surgery his appendectomy performed in the 1960s.  Patient has had an extensive evaluation including abdominal ultrasound which was normal. Abdominal CT scan which was normal. Nuclear medicine hepatobiliary scan which showed a normal ejection fraction but the patient did experience symptoms with administration of oral Ensure to drink.  Patient has been evaluated by Dr. Yancey Flemings from gastroenterology and has a normal EGD and a normal colonoscopy.  Past Medical History  Diagnosis Date  . Diverticulosis of colon (without mention of hemorrhage)   . Internal hemorrhoids without mention of complication   . Acute gastritis without mention of hemorrhage   . Glaucoma   . Arthritis     Current Outpatient Prescriptions  Medication Sig Dispense Refill  . latanoprost (XALATAN) 0.005 % ophthalmic solution       . omeprazole (PRILOSEC) 40 MG capsule        No current facility-administered medications for this visit.    No Known Allergies  Family History  Problem Relation Age of Onset  . Colon cancer Neg Hx   . Pancreatic cancer Father   . Cancer Father     pancreatic  . Melanoma Maternal Grandfather   . Leukemia Paternal Grandfather     History    Social History  . Marital Status: Married    Spouse Name: N/A    Number of Children: N/A  . Years of Education: N/A   Social History Main Topics  . Smoking status: Never Smoker   . Smokeless tobacco: Never Used  . Alcohol Use: No  . Drug Use: No  . Sexual Activity: None   Other Topics Concern  . None   Social History Narrative  . None    REVIEW OF SYSTEMS - PERTINENT POSITIVES ONLY: Right upper quadrant abdominal pain radiating to the back, persistent nausea, weight loss of 20 pounds.  EXAM: Filed Vitals:   06/18/14 1130  BP: 132/86  Pulse: 60  Temp: 98.9 F (37.2 C)  Resp: 12    GENERAL: well-developed, well-nourished, no acute distress HEENT: normocephalic; pupils equal and reactive; sclerae clear; dentition good; mucous membranes moist NECK:  No palpable masses in the thyroid bed; symmetric on extension; no palpable anterior or posterior cervical lymphadenopathy; no supraclavicular masses; no tenderness CHEST: clear to auscultation bilaterally without rales, rhonchi, or wheezes CARDIAC: regular rate and rhythm without significant murmur; peripheral pulses are full ABDOMEN: soft without distension; bowel sounds present; no mass; no hepatosplenomegaly; no hernia EXT:  non-tender without edema; no deformity NEURO: no gross focal deficits; no sign of tremor   LABORATORY RESULTS: See [facility] ([facility]-E[facility]) for most recent results  RADIOLOGY RESULTS: See [facility] (CHL-Epic) for most recent results  IMPRESSION: Right upper quadrant abdominal pain with nausea of undetermined etiology  PLAN: I had a lengthy discussion today  with the patient and her husband. We reviewed all of the above studies. I provided him with written literature on cholecystectomy to review at home.  After careful consideration of the patient's clinical course, I have recommended laparoscopic cholecystectomy with intraoperative cholangiography. We have discussed the procedure, the  potential complications, the hospital stay to be anticipated, and her recovery. I believe there is approximately a 60-70% chance of symptomatic improvement. Patient and her husband understand and wish to proceed with cholecystectomy in the near future.  The risks and benefits of the procedure have been discussed at length with the patient.  The patient understands the proposed procedure, potential alternative treatments, and the course of recovery to be expected.  All of the patient's questions have been answered at this time.  The patient wishes to proceed with surgery.  Velora Hecklerodd M. Danyla Wattley, MD, FACS General & Endocrine Surgery [facility]  Primary Care Physician: Minda MeoARONSON,RICHARD A, MD

## 2014-07-11 NOTE — Transfer of Care (Signed)
Immediate Anesthesia Transfer of Care Note  Patient: Cheyenne SartoriusLinda E Clark  Procedure(s) Performed: Procedure(s): LAPAROSCOPIC CHOLECYSTECTOMY WITH INTRAOPERATIVE CHOLANGIOGRAM (N/A)  Patient Location: PACU  Anesthesia Type:General  Level of Consciousness: awake, oriented, patient cooperative, lethargic and responds to stimulation  Airway & Oxygen Therapy: Patient Spontanous Breathing and Patient connected to face mask oxygen  Post-op Assessment: Report given to PACU RN, Post -op Vital signs reviewed and stable and Patient moving all extremities  Post vital signs: Reviewed and stable  Complications: No apparent anesthesia complications

## 2014-07-11 NOTE — Progress Notes (Signed)
Nurse reviewed discharge instructions with pt and family. Pt verbalized understanding of discharge instructions, follow up appointments and new medications.  No concerns at time of discharge. 

## 2014-07-11 NOTE — Op Note (Signed)
Procedure Note  Pre-operative Diagnosis:  Right upper quadrant abdominal pain, nausea, suspected biliary dyskinesia  Post-operative Diagnosis:  same  Surgeon:  Velora Heckler, MD, FACS  Assistant:  none   Procedure:  Laparoscopic cholecystectomy with intra-operative cholangiography  Anesthesia:  General  Estimated Blood Loss:  minimal  Drains: none         Specimen: Gallbladder to pathology  Indications:  Patient is a 68 year old female referred by her primary care physician for evaluation of right upper quadrant abdominal pain radiating to the back associated with nausea. Patient has been symptomatic since December 2014. Symptoms markedly intensified in March of 2015. She has persistent nausea. Right upper quadrant abdominal pain radiating to the back is worse at night. She has rare episodes of emesis. Patient denies fevers or chills. She denies jaundice or acholic stools. Family history is notable for cholecystectomy in the patient's mother. Only prior abdominal surgery his appendectomy performed in the 1960s. Patient has had an extensive evaluation including abdominal ultrasound which was normal. Abdominal CT scan which was normal. Nuclear medicine hepatobiliary scan which showed a normal ejection fraction but the patient did experience symptoms with administration of oral Ensure to drink. Patient has been evaluated by Dr. Yancey Flemings from gastroenterology and has a normal EGD and a normal colonoscopy.  Procedure Details:  The patient was seen in the pre-op holding area. The risks, benefits, complications, treatment options, and expected outcomes were previously discussed with the patient. The patient agreed with the proposed plan and has signed the informed consent form.  The patient was brought to the Operating Room, identified as Cheyenne Clark and the procedure verified as laparoscopic cholecystectomy with intraoperative cholangiography. A "time out" was completed and the above  information confirmed.  The patient was placed in the supine position. Following induction of general anesthesia, the abdomen was prepped and draped in the usual aseptic fashion.  An incision was made in the skin near the umbilicus. The midline fascia was incised and the peritoneal cavity was entered and a Hasson canula was introduced under direct vision.  The Hasson canula was secured with a 0-Vicryl pursestring suture. Pneumoperitoneum was established with carbon dioxide. Additional trocars were introduced under direct vision along the right costal margin in the midline, mid-clavicular line, and anterior axillary line.   The gallbladder was identified and the fundus grasped and retracted cephalad. Adhesions were taken down bluntly and the electrocautery was utilized as needed, taking care not to injure any adjacent structures. The infundibulum was grasped and retracted laterally, exposing the peritoneum overlying the triangle of Calot. The peritoneum was incised and structures exposed with blunt dissection. The cystic duct was clearly identified, bluntly dissected circumferentially, and clipped at the neck of the gallbladder.  An incision was made in the cystic duct and the cholangiogram catheter introduced. The catheter was secured using an ligaclip.  Real-time cholangiography was performed using C-arm fluoroscopy.  There was rapid filling of a normal caliber common bile duct.  There was reflux of contrast into the left and right hepatic ductal systems.  There was free flow distally into the duodenum without filling defect or obstruction.  The catheter was removed from the peritoneal cavity.  The cystic duct was then ligated with surgical clips and divided. The cystic artery was identified, dissected circumferentially, ligated with ligaclips, and divided.  The gallbladder was dissected away from the liver bed using the electrocautery for hemostasis. The gallbladder was completely removed from the  liver and placed into an endocatch  bag. The gallbladder was removed in the endocatch bag through the umbilical port site and submitted to pathology for review.  The right upper quadrant was irrigated and the gallbladder bed was inspected. Hemostasis was achieved with the electrocautery.  Pneumoperitoneum was released after viewing removal of the trocars with good hemostasis noted. The umbilical wound was irrigated and the fascia was then closed with the pursestring suture.  Local anesthetic was infiltrated at all port sites. The skin incisions were closed with 4-0 Monocril subcuticular sutures and steri-strips and dressings were applied.  Instrument, sponge, and needle counts were correct at the conclusion of the case.  The patient was awakened from anesthesia and brought to the recovery room in stable condition.  The patient tolerated the procedure well.   Velora Heckler, MD, [facility] Office: [phone]

## 2014-07-11 NOTE — Anesthesia Preprocedure Evaluation (Addendum)
Anesthesia Evaluation  Patient identified by MRN, date of birth, ID band Patient awake    Reviewed: Allergy & Precautions, H&P , NPO status , Patient's Chart, lab work & pertinent test results  Airway Mallampati: II TM Distance: >3 FB Neck ROM: Full    Dental no notable dental hx. (+) Caps   Pulmonary neg pulmonary ROS,  breath sounds clear to auscultation  Pulmonary exam normal       Cardiovascular negative cardio ROS  Rhythm:Regular Rate:Normal     Neuro/Psych negative neurological ROS  negative psych ROS   GI/Hepatic negative GI ROS, Neg liver ROS,   Endo/Other  negative endocrine ROS  Renal/GU negative Renal ROS  negative genitourinary   Musculoskeletal negative musculoskeletal ROS (+)   Abdominal   Peds negative pediatric ROS (+)  Hematology negative hematology ROS (+)   Anesthesia Other Findings   Reproductive/Obstetrics negative OB ROS                           Anesthesia Physical Anesthesia Plan  ASA: II  Anesthesia Plan: General   Post-op Pain Management:    Induction: Intravenous  Airway Management Planned: Oral ETT  Additional Equipment:   Intra-op Plan:   Post-operative Plan: Extubation in OR  Informed Consent: I have reviewed the patients History and Physical, chart, labs and discussed the procedure including the risks, benefits and alternatives for the proposed anesthesia with the patient or authorized representative who has indicated his/her understanding and acceptance.   Dental advisory given  Plan Discussed with: CRNA  Anesthesia Plan Comments:         Anesthesia Quick Evaluation

## 2014-07-11 NOTE — Anesthesia Procedure Notes (Signed)
Procedure Name: Intubation Date/Time: 07/11/2014 7:32 AM Performed by: Edison PaceGRAY, Teralyn Mullins E Pre-anesthesia Checklist: Patient identified, Emergency Drugs available, Suction available, Patient being monitored and Timeout performed Patient Re-evaluated:Patient Re-evaluated prior to inductionOxygen Delivery Method: Circle system utilized Preoxygenation: Pre-oxygenation with 100% oxygen Intubation Type: IV induction Ventilation: Mask ventilation without difficulty Laryngoscope Size: Mac and 4 Grade View: Grade II Tube type: Oral Tube size: 7.0 mm Number of attempts: 1 Airway Equipment and Method: Stylet Placement Confirmation: ETT inserted through vocal cords under direct vision,  positive ETCO2 and breath sounds checked- equal and bilateral Secured at: 20 cm Tube secured with: Tape Dental Injury: Teeth and Oropharynx as per pre-operative assessment  Comments: Cords seen with head lift/cricoid pressure

## 2014-07-11 NOTE — Interval H&P Note (Signed)
History and Physical Interval Note:  07/11/2014 7:10 AM  Cheyenne SartoriusLinda E Clark  has presented today for surgery, with the diagnosis of RUQ Abdominal Pain and Nausea.  The various methods of treatment have been discussed with the patient and family. After consideration of risks, benefits and other options for treatment, the patient has consented to    Procedure(s): LAPAROSCOPIC CHOLECYSTECTOMY WITH INTRAOPERATIVE CHOLANGIOGRAM (N/A) as a surgical intervention .    The patient's history has been reviewed, patient examined, no change in status, stable for surgery.  I have reviewed the patient's chart and labs.  Questions were answered to the patient's satisfaction.    Velora Hecklerodd M. Jumanah Hynson, MD, [facility] Office: [phone]    Tamarius Rosenfield MJudie Petit

## 2014-07-12 NOTE — Discharge Summary (Signed)
Physician Discharge Summary [facility]  Patient ID: Cheyenne Clark MRN: [id number] DOB/AGE: 67-Dec-1947 67 y.o.  Admit date: 07/11/2014 Discharge date: 07/12/2014  Admission Diagnoses:  Abdominal pain, biliary dyskinesia  Discharge Diagnoses:  Principal Problem:   RUQ pain Active Problems:   Nausea with vomiting   Biliary dyskinesia   Discharged Condition: good  Hospital Course: patient observed for 6 hours after lap chole with IOC.  Tolerated diet.  Ambulated.  Pain well controlled.  Discharged home with family.  Consults: None  Treatments: surgery: lap chole with IOC  Discharge Exam: Blood pressure 119/56, pulse 80, temperature 97.6 F (36.4 C), temperature source Oral, resp. rate 16, height  (1.499 m), weight 130 lb (58.968 kg), SpO2 94.00%. See op note and anesthesia note from PACU.  Disposition: Home  Discharge Instructions   Diet - low sodium heart healthy    Complete by:  As directed      Discharge instructions    Complete by:  As directed   [facility]  LAPAROSCOPIC SURGERY - POST-OP INSTRUCTIONS  Always review your discharge instruction sheet given to you by the facility where your surgery was performed.  A prescription for pain medication may be given to you upon discharge.  Take your pain medication as prescribed.  If narcotic pain medicine is not needed, then you may take acetaminophen (Tylenol) or ibuprofen (Advil) as needed.  Take your usually prescribed medications unless otherwise directed.  If you need a refill on your pain medication, please contact your pharmacy.  They will contact our office to request authorization. Prescriptions will not be filled after 5 P.M. or on weekends.  You should follow a light diet the first few days after arrival home, such as soup and crackers or toast.  Be sure to include plenty of fluids daily.  Most patients will experience some swelling and bruising in the area of the  incisions.  Ice packs will help.  Swelling and bruising can take several days to resolve.   It is common to experience some constipation if taking pain medication after surgery.  Increasing fluid intake and taking a stool softener (such as Colace) will usually help or prevent this problem from occurring.  A mild laxative (Milk of Magnesia or Miralax) should be taken according to package instructions if there are no bowel movements after 48 hours.  Unless discharge instructions indicate otherwise, you may remove your bandages 24-48 hours after surgery, and you may shower at that time.  You may have steri-strips (small skin tapes) in place directly over the incision.  These strips should be left on the skin for 7-10 days.  If your surgeon used skin glue on the incision, you may shower in 24 hours.  The glue will flake off over the next 2-3 weeks.  Any sutures or staples will be removed at the office during your follow-up visit.  ACTIVITIES:  You may resume regular (light) daily activities beginning the next day-such as daily self-care, walking, climbing stairs-gradually increasing activities as tolerated.  You may have sexual intercourse when it is comfortable.  Refrain from any heavy lifting or straining until approved by your doctor.  You may drive when you are no longer taking prescription pain medication, you can comfortably wear a seatbelt, and you can safely maneuver your car and apply brakes.  You should see your doctor in the office for a follow-up appointment approximately 2-3 weeks after your surgery.  Make sure that you call for  this appointment within a day or two after you arrive home to insure a convenient appointment time.  WHEN TO CALL YOUR DOCTOR: Fever over 101.0 Inability to urinate Continued bleeding from incision Increased pain, redness, or drainage from the incision Increasing abdominal pain  The clinic staff is available to answer your questions during regular business hours.   Please don't hesitate to call and ask to speak to one of the nurses for clinical concerns.  If you have a medical emergency, go to the nearest emergency room or call 911.  A surgeon from [facility] is always on call for the hospital.  Velora Heckler, MD, [facility] Office: [phone] Toll Free:  [phone] FAX [phone]  Web site: [email or web address]     Increase activity slowly    Complete by:  As directed      Remove dressing in 24 hours    Complete by:  As directed             Medication List         HYDROcodone-acetaminophen 5-325 MG per tablet  Commonly known as:  NORCO/VICODIN  Take 1-2 tablets by mouth every 4 (four) hours as needed for moderate pain.     latanoprost 0.005 % ophthalmic solution  Commonly known as:  XALATAN  Place 1 drop into both eyes daily.     multivitamin with minerals Tabs tablet  Take 1 tablet by mouth daily.           Follow-up Information   Follow up with Velora Heckler, MD. Schedule an appointment as soon as possible for a visit in 3 weeks. (For wound re-check)    Specialty:  General Surgery   Contact information:   [address] [place] [address] [phone]       Velora Heckler, MD, [facility] Office: [phone]   Signed: Velora Heckler 07/12/2014, 12:00 AM

## 2014-07-14 ENCOUNTER — Encounter (HOSPITAL_COMMUNITY): Payer: Self-pay | Admitting: Surgery

## 2014-08-06 ENCOUNTER — Encounter (INDEPENDENT_AMBULATORY_CARE_PROVIDER_SITE_OTHER): Payer: Medicare HMO | Admitting: Surgery

## 2016-03-29 DIAGNOSIS — H401132 Primary open-angle glaucoma, bilateral, moderate stage: Secondary | ICD-10-CM | POA: Diagnosis not present

## 2016-09-15 DIAGNOSIS — R8290 Unspecified abnormal findings in urine: Secondary | ICD-10-CM | POA: Diagnosis not present

## 2016-09-15 DIAGNOSIS — R7989 Other specified abnormal findings of blood chemistry: Secondary | ICD-10-CM | POA: Diagnosis not present

## 2016-09-15 DIAGNOSIS — Z0000 Encounter for general adult medical examination without abnormal findings: Secondary | ICD-10-CM | POA: Diagnosis not present

## 2016-09-15 DIAGNOSIS — N390 Urinary tract infection, site not specified: Secondary | ICD-10-CM | POA: Diagnosis not present

## 2016-09-15 DIAGNOSIS — Z6828 Body mass index (BMI) 28.0-28.9, adult: Secondary | ICD-10-CM | POA: Diagnosis not present

## 2016-09-15 DIAGNOSIS — E663 Overweight: Secondary | ICD-10-CM | POA: Diagnosis not present

## 2016-10-27 DIAGNOSIS — Z0100 Encounter for examination of eyes and vision without abnormal findings: Secondary | ICD-10-CM | POA: Diagnosis not present

## 2017-07-06 DIAGNOSIS — E663 Overweight: Secondary | ICD-10-CM | POA: Diagnosis not present

## 2017-07-06 DIAGNOSIS — Z1231 Encounter for screening mammogram for malignant neoplasm of breast: Secondary | ICD-10-CM | POA: Diagnosis not present

## 2017-07-06 DIAGNOSIS — Z780 Asymptomatic menopausal state: Secondary | ICD-10-CM | POA: Diagnosis not present

## 2017-07-06 DIAGNOSIS — Z01419 Encounter for gynecological examination (general) (routine) without abnormal findings: Secondary | ICD-10-CM | POA: Diagnosis not present

## 2017-07-06 DIAGNOSIS — Z6829 Body mass index (BMI) 29.0-29.9, adult: Secondary | ICD-10-CM | POA: Diagnosis not present

## 2017-07-06 DIAGNOSIS — M19079 Primary osteoarthritis, unspecified ankle and foot: Secondary | ICD-10-CM | POA: Diagnosis not present

## 2017-07-06 DIAGNOSIS — Z1389 Encounter for screening for other disorder: Secondary | ICD-10-CM | POA: Diagnosis not present

## 2018-01-18 DIAGNOSIS — M19079 Primary osteoarthritis, unspecified ankle and foot: Secondary | ICD-10-CM | POA: Diagnosis not present

## 2018-01-18 DIAGNOSIS — R82998 Other abnormal findings in urine: Secondary | ICD-10-CM | POA: Diagnosis not present

## 2018-01-18 DIAGNOSIS — Z0000 Encounter for general adult medical examination without abnormal findings: Secondary | ICD-10-CM | POA: Diagnosis not present

## 2018-01-18 DIAGNOSIS — E663 Overweight: Secondary | ICD-10-CM | POA: Diagnosis not present

## 2018-01-18 DIAGNOSIS — R011 Cardiac murmur, unspecified: Secondary | ICD-10-CM | POA: Diagnosis not present

## 2018-01-22 DIAGNOSIS — H40053 Ocular hypertension, bilateral: Secondary | ICD-10-CM | POA: Diagnosis not present

## 2018-01-22 DIAGNOSIS — H2513 Age-related nuclear cataract, bilateral: Secondary | ICD-10-CM | POA: Diagnosis not present

## 2018-02-07 DIAGNOSIS — M25512 Pain in left shoulder: Secondary | ICD-10-CM | POA: Diagnosis not present

## 2018-02-27 DIAGNOSIS — H401132 Primary open-angle glaucoma, bilateral, moderate stage: Secondary | ICD-10-CM | POA: Diagnosis not present

## 2018-03-12 DIAGNOSIS — M75122 Complete rotator cuff tear or rupture of left shoulder, not specified as traumatic: Secondary | ICD-10-CM | POA: Diagnosis not present

## 2018-03-15 DIAGNOSIS — M7522 Bicipital tendinitis, left shoulder: Secondary | ICD-10-CM | POA: Diagnosis not present

## 2018-03-15 DIAGNOSIS — M75112 Incomplete rotator cuff tear or rupture of left shoulder, not specified as traumatic: Secondary | ICD-10-CM | POA: Diagnosis not present

## 2018-03-15 DIAGNOSIS — M75122 Complete rotator cuff tear or rupture of left shoulder, not specified as traumatic: Secondary | ICD-10-CM | POA: Diagnosis not present

## 2018-03-15 DIAGNOSIS — M7552 Bursitis of left shoulder: Secondary | ICD-10-CM | POA: Diagnosis not present

## 2018-03-19 DIAGNOSIS — M75122 Complete rotator cuff tear or rupture of left shoulder, not specified as traumatic: Secondary | ICD-10-CM | POA: Diagnosis not present

## 2018-05-03 DIAGNOSIS — M75122 Complete rotator cuff tear or rupture of left shoulder, not specified as traumatic: Secondary | ICD-10-CM | POA: Diagnosis not present

## 2018-05-15 DIAGNOSIS — H401132 Primary open-angle glaucoma, bilateral, moderate stage: Secondary | ICD-10-CM | POA: Diagnosis not present

## 2018-05-22 DIAGNOSIS — M25512 Pain in left shoulder: Secondary | ICD-10-CM | POA: Diagnosis not present

## 2018-05-22 DIAGNOSIS — M6281 Muscle weakness (generalized): Secondary | ICD-10-CM | POA: Diagnosis not present

## 2018-05-29 DIAGNOSIS — M25512 Pain in left shoulder: Secondary | ICD-10-CM | POA: Diagnosis not present

## 2018-05-29 DIAGNOSIS — M6281 Muscle weakness (generalized): Secondary | ICD-10-CM | POA: Diagnosis not present

## 2018-05-31 DIAGNOSIS — M25512 Pain in left shoulder: Secondary | ICD-10-CM | POA: Diagnosis not present

## 2018-05-31 DIAGNOSIS — M6281 Muscle weakness (generalized): Secondary | ICD-10-CM | POA: Diagnosis not present

## 2018-06-04 DIAGNOSIS — M6281 Muscle weakness (generalized): Secondary | ICD-10-CM | POA: Diagnosis not present

## 2018-06-04 DIAGNOSIS — M25512 Pain in left shoulder: Secondary | ICD-10-CM | POA: Diagnosis not present

## 2018-06-07 DIAGNOSIS — M6281 Muscle weakness (generalized): Secondary | ICD-10-CM | POA: Diagnosis not present

## 2018-06-07 DIAGNOSIS — M25512 Pain in left shoulder: Secondary | ICD-10-CM | POA: Diagnosis not present

## 2018-06-12 DIAGNOSIS — M6281 Muscle weakness (generalized): Secondary | ICD-10-CM | POA: Diagnosis not present

## 2018-06-12 DIAGNOSIS — M25512 Pain in left shoulder: Secondary | ICD-10-CM | POA: Diagnosis not present

## 2018-06-14 DIAGNOSIS — M6281 Muscle weakness (generalized): Secondary | ICD-10-CM | POA: Diagnosis not present

## 2018-06-14 DIAGNOSIS — M25512 Pain in left shoulder: Secondary | ICD-10-CM | POA: Diagnosis not present

## 2018-07-03 DIAGNOSIS — M6281 Muscle weakness (generalized): Secondary | ICD-10-CM | POA: Diagnosis not present

## 2018-07-03 DIAGNOSIS — M25512 Pain in left shoulder: Secondary | ICD-10-CM | POA: Diagnosis not present

## 2018-07-05 DIAGNOSIS — M6281 Muscle weakness (generalized): Secondary | ICD-10-CM | POA: Diagnosis not present

## 2018-07-05 DIAGNOSIS — M25512 Pain in left shoulder: Secondary | ICD-10-CM | POA: Diagnosis not present

## 2018-07-10 DIAGNOSIS — M6281 Muscle weakness (generalized): Secondary | ICD-10-CM | POA: Diagnosis not present

## 2018-07-10 DIAGNOSIS — M25512 Pain in left shoulder: Secondary | ICD-10-CM | POA: Diagnosis not present

## 2018-07-12 DIAGNOSIS — M25512 Pain in left shoulder: Secondary | ICD-10-CM | POA: Diagnosis not present

## 2018-07-12 DIAGNOSIS — M6281 Muscle weakness (generalized): Secondary | ICD-10-CM | POA: Diagnosis not present

## 2018-07-19 DIAGNOSIS — M75122 Complete rotator cuff tear or rupture of left shoulder, not specified as traumatic: Secondary | ICD-10-CM | POA: Diagnosis not present

## 2018-09-03 DIAGNOSIS — M75122 Complete rotator cuff tear or rupture of left shoulder, not specified as traumatic: Secondary | ICD-10-CM | POA: Diagnosis not present

## 2018-09-11 DIAGNOSIS — H401132 Primary open-angle glaucoma, bilateral, moderate stage: Secondary | ICD-10-CM | POA: Diagnosis not present

## 2018-12-13 DIAGNOSIS — G5603 Carpal tunnel syndrome, bilateral upper limbs: Secondary | ICD-10-CM | POA: Diagnosis not present

## 2018-12-13 DIAGNOSIS — M75122 Complete rotator cuff tear or rupture of left shoulder, not specified as traumatic: Secondary | ICD-10-CM | POA: Diagnosis not present

## 2018-12-17 DIAGNOSIS — G5602 Carpal tunnel syndrome, left upper limb: Secondary | ICD-10-CM | POA: Diagnosis not present

## 2018-12-17 DIAGNOSIS — M542 Cervicalgia: Secondary | ICD-10-CM | POA: Diagnosis not present

## 2018-12-17 DIAGNOSIS — G5601 Carpal tunnel syndrome, right upper limb: Secondary | ICD-10-CM | POA: Diagnosis not present

## 2018-12-20 DIAGNOSIS — G5603 Carpal tunnel syndrome, bilateral upper limbs: Secondary | ICD-10-CM | POA: Diagnosis not present

## 2019-05-30 DIAGNOSIS — R300 Dysuria: Secondary | ICD-10-CM | POA: Diagnosis not present

## 2019-05-30 DIAGNOSIS — Z01419 Encounter for gynecological examination (general) (routine) without abnormal findings: Secondary | ICD-10-CM | POA: Diagnosis not present

## 2019-05-30 DIAGNOSIS — Z1231 Encounter for screening mammogram for malignant neoplasm of breast: Secondary | ICD-10-CM | POA: Diagnosis not present

## 2019-05-30 DIAGNOSIS — Z780 Asymptomatic menopausal state: Secondary | ICD-10-CM | POA: Diagnosis not present

## 2019-10-07 DIAGNOSIS — M19079 Primary osteoarthritis, unspecified ankle and foot: Secondary | ICD-10-CM | POA: Diagnosis not present

## 2019-10-07 DIAGNOSIS — N390 Urinary tract infection, site not specified: Secondary | ICD-10-CM | POA: Diagnosis not present

## 2019-10-07 DIAGNOSIS — R8290 Unspecified abnormal findings in urine: Secondary | ICD-10-CM | POA: Diagnosis not present

## 2019-10-07 DIAGNOSIS — R011 Cardiac murmur, unspecified: Secondary | ICD-10-CM | POA: Diagnosis not present

## 2019-10-07 DIAGNOSIS — E663 Overweight: Secondary | ICD-10-CM | POA: Diagnosis not present

## 2019-10-07 DIAGNOSIS — Z0000 Encounter for general adult medical examination without abnormal findings: Secondary | ICD-10-CM | POA: Diagnosis not present

## 2020-01-13 DIAGNOSIS — H25013 Cortical age-related cataract, bilateral: Secondary | ICD-10-CM | POA: Diagnosis not present

## 2020-01-13 DIAGNOSIS — H2513 Age-related nuclear cataract, bilateral: Secondary | ICD-10-CM | POA: Diagnosis not present

## 2020-01-13 DIAGNOSIS — H35033 Hypertensive retinopathy, bilateral: Secondary | ICD-10-CM | POA: Diagnosis not present

## 2020-01-13 DIAGNOSIS — H401132 Primary open-angle glaucoma, bilateral, moderate stage: Secondary | ICD-10-CM | POA: Diagnosis not present

## 2020-03-25 DIAGNOSIS — E663 Overweight: Secondary | ICD-10-CM | POA: Diagnosis not present

## 2020-03-25 DIAGNOSIS — R011 Cardiac murmur, unspecified: Secondary | ICD-10-CM | POA: Diagnosis not present

## 2020-03-25 DIAGNOSIS — M19079 Primary osteoarthritis, unspecified ankle and foot: Secondary | ICD-10-CM | POA: Diagnosis not present

## 2020-06-25 DIAGNOSIS — H25013 Cortical age-related cataract, bilateral: Secondary | ICD-10-CM | POA: Diagnosis not present

## 2020-06-25 DIAGNOSIS — H401132 Primary open-angle glaucoma, bilateral, moderate stage: Secondary | ICD-10-CM | POA: Diagnosis not present

## 2020-08-24 DIAGNOSIS — S62222A Displaced Rolando's fracture, left hand, initial encounter for closed fracture: Secondary | ICD-10-CM | POA: Diagnosis not present

## 2020-09-22 DIAGNOSIS — G5601 Carpal tunnel syndrome, right upper limb: Secondary | ICD-10-CM | POA: Diagnosis not present

## 2020-09-22 DIAGNOSIS — S62222D Displaced Rolando's fracture, left hand, subsequent encounter for fracture with routine healing: Secondary | ICD-10-CM | POA: Diagnosis not present

## 2020-10-06 DIAGNOSIS — Z01810 Encounter for preprocedural cardiovascular examination: Secondary | ICD-10-CM | POA: Diagnosis not present

## 2020-10-06 DIAGNOSIS — G5601 Carpal tunnel syndrome, right upper limb: Secondary | ICD-10-CM | POA: Diagnosis not present

## 2020-10-06 DIAGNOSIS — Z7982 Long term (current) use of aspirin: Secondary | ICD-10-CM | POA: Diagnosis not present

## 2020-10-06 DIAGNOSIS — Z20822 Contact with and (suspected) exposure to covid-19: Secondary | ICD-10-CM | POA: Diagnosis not present

## 2020-10-12 DIAGNOSIS — E663 Overweight: Secondary | ICD-10-CM | POA: Diagnosis not present

## 2020-10-12 DIAGNOSIS — R82998 Other abnormal findings in urine: Secondary | ICD-10-CM | POA: Diagnosis not present

## 2020-10-12 DIAGNOSIS — R011 Cardiac murmur, unspecified: Secondary | ICD-10-CM | POA: Diagnosis not present

## 2020-10-12 DIAGNOSIS — M19079 Primary osteoarthritis, unspecified ankle and foot: Secondary | ICD-10-CM | POA: Diagnosis not present

## 2020-10-12 DIAGNOSIS — Z0000 Encounter for general adult medical examination without abnormal findings: Secondary | ICD-10-CM | POA: Diagnosis not present

## 2020-10-25 DIAGNOSIS — G5601 Carpal tunnel syndrome, right upper limb: Secondary | ICD-10-CM | POA: Diagnosis not present

## 2020-10-25 DIAGNOSIS — Z20822 Contact with and (suspected) exposure to covid-19: Secondary | ICD-10-CM | POA: Diagnosis not present

## 2020-10-25 DIAGNOSIS — Z7982 Long term (current) use of aspirin: Secondary | ICD-10-CM | POA: Diagnosis not present

## 2020-10-28 DIAGNOSIS — Z20822 Contact with and (suspected) exposure to covid-19: Secondary | ICD-10-CM | POA: Diagnosis not present

## 2020-10-28 DIAGNOSIS — Z7982 Long term (current) use of aspirin: Secondary | ICD-10-CM | POA: Diagnosis not present

## 2020-10-28 DIAGNOSIS — G5601 Carpal tunnel syndrome, right upper limb: Secondary | ICD-10-CM | POA: Diagnosis not present

## 2020-11-06 DIAGNOSIS — H401132 Primary open-angle glaucoma, bilateral, moderate stage: Secondary | ICD-10-CM | POA: Diagnosis not present

## 2021-03-18 DIAGNOSIS — H25013 Cortical age-related cataract, bilateral: Secondary | ICD-10-CM | POA: Diagnosis not present

## 2021-03-18 DIAGNOSIS — H04123 Dry eye syndrome of bilateral lacrimal glands: Secondary | ICD-10-CM | POA: Diagnosis not present

## 2021-03-18 DIAGNOSIS — H401132 Primary open-angle glaucoma, bilateral, moderate stage: Secondary | ICD-10-CM | POA: Diagnosis not present

## 2021-04-26 DIAGNOSIS — E663 Overweight: Secondary | ICD-10-CM | POA: Diagnosis not present

## 2021-04-26 DIAGNOSIS — M19079 Primary osteoarthritis, unspecified ankle and foot: Secondary | ICD-10-CM | POA: Diagnosis not present

## 2021-04-26 DIAGNOSIS — R011 Cardiac murmur, unspecified: Secondary | ICD-10-CM | POA: Diagnosis not present

## 2021-06-08 DIAGNOSIS — M19071 Primary osteoarthritis, right ankle and foot: Secondary | ICD-10-CM | POA: Diagnosis not present

## 2021-06-08 DIAGNOSIS — M79671 Pain in right foot: Secondary | ICD-10-CM | POA: Diagnosis not present

## 2021-06-08 DIAGNOSIS — M19072 Primary osteoarthritis, left ankle and foot: Secondary | ICD-10-CM | POA: Diagnosis not present

## 2021-06-08 DIAGNOSIS — M79672 Pain in left foot: Secondary | ICD-10-CM | POA: Diagnosis not present

## 2021-07-16 DIAGNOSIS — H5203 Hypermetropia, bilateral: Secondary | ICD-10-CM | POA: Diagnosis not present

## 2021-07-16 DIAGNOSIS — H524 Presbyopia: Secondary | ICD-10-CM | POA: Diagnosis not present

## 2021-07-16 DIAGNOSIS — D3132 Benign neoplasm of left choroid: Secondary | ICD-10-CM | POA: Diagnosis not present

## 2021-07-16 DIAGNOSIS — H16143 Punctate keratitis, bilateral: Secondary | ICD-10-CM | POA: Diagnosis not present

## 2021-08-02 DIAGNOSIS — M1712 Unilateral primary osteoarthritis, left knee: Secondary | ICD-10-CM | POA: Diagnosis not present

## 2021-08-03 DIAGNOSIS — Z1231 Encounter for screening mammogram for malignant neoplasm of breast: Secondary | ICD-10-CM | POA: Diagnosis not present

## 2021-08-03 DIAGNOSIS — Z01419 Encounter for gynecological examination (general) (routine) without abnormal findings: Secondary | ICD-10-CM | POA: Diagnosis not present

## 2021-08-03 DIAGNOSIS — R030 Elevated blood-pressure reading, without diagnosis of hypertension: Secondary | ICD-10-CM | POA: Diagnosis not present

## 2021-09-02 DIAGNOSIS — M1712 Unilateral primary osteoarthritis, left knee: Secondary | ICD-10-CM | POA: Diagnosis not present

## 2021-11-01 DIAGNOSIS — R011 Cardiac murmur, unspecified: Secondary | ICD-10-CM | POA: Diagnosis not present

## 2021-11-01 DIAGNOSIS — E663 Overweight: Secondary | ICD-10-CM | POA: Diagnosis not present

## 2021-11-01 DIAGNOSIS — R82998 Other abnormal findings in urine: Secondary | ICD-10-CM | POA: Diagnosis not present

## 2021-11-09 DIAGNOSIS — M1712 Unilateral primary osteoarthritis, left knee: Secondary | ICD-10-CM | POA: Diagnosis not present

## 2021-12-14 DIAGNOSIS — M1712 Unilateral primary osteoarthritis, left knee: Secondary | ICD-10-CM | POA: Diagnosis not present

## 2022-04-27 DIAGNOSIS — H25013 Cortical age-related cataract, bilateral: Secondary | ICD-10-CM | POA: Diagnosis not present

## 2022-04-27 DIAGNOSIS — H35371 Puckering of macula, right eye: Secondary | ICD-10-CM | POA: Diagnosis not present

## 2022-04-27 DIAGNOSIS — H401132 Primary open-angle glaucoma, bilateral, moderate stage: Secondary | ICD-10-CM | POA: Diagnosis not present

## 2022-04-27 DIAGNOSIS — H2513 Age-related nuclear cataract, bilateral: Secondary | ICD-10-CM | POA: Diagnosis not present

## 2022-05-09 DIAGNOSIS — R011 Cardiac murmur, unspecified: Secondary | ICD-10-CM | POA: Diagnosis not present

## 2022-05-09 DIAGNOSIS — M19079 Primary osteoarthritis, unspecified ankle and foot: Secondary | ICD-10-CM | POA: Diagnosis not present

## 2022-05-09 DIAGNOSIS — M62838 Other muscle spasm: Secondary | ICD-10-CM | POA: Diagnosis not present

## 2022-05-09 DIAGNOSIS — E663 Overweight: Secondary | ICD-10-CM | POA: Diagnosis not present

## 2022-05-17 DIAGNOSIS — L821 Other seborrheic keratosis: Secondary | ICD-10-CM | POA: Diagnosis not present

## 2022-05-17 DIAGNOSIS — L308 Other specified dermatitis: Secondary | ICD-10-CM | POA: Diagnosis not present

## 2022-06-09 DIAGNOSIS — M1712 Unilateral primary osteoarthritis, left knee: Secondary | ICD-10-CM | POA: Diagnosis not present

## 2022-11-28 DIAGNOSIS — R011 Cardiac murmur, unspecified: Secondary | ICD-10-CM | POA: Diagnosis not present

## 2022-11-28 DIAGNOSIS — Z23 Encounter for immunization: Secondary | ICD-10-CM | POA: Diagnosis not present

## 2022-11-28 DIAGNOSIS — Z0000 Encounter for general adult medical examination without abnormal findings: Secondary | ICD-10-CM | POA: Diagnosis not present

## 2022-11-28 DIAGNOSIS — Z1331 Encounter for screening for depression: Secondary | ICD-10-CM | POA: Diagnosis not present

## 2022-11-28 DIAGNOSIS — R82998 Other abnormal findings in urine: Secondary | ICD-10-CM | POA: Diagnosis not present

## 2022-11-28 DIAGNOSIS — E663 Overweight: Secondary | ICD-10-CM | POA: Diagnosis not present

## 2022-11-28 DIAGNOSIS — Z1339 Encounter for screening examination for other mental health and behavioral disorders: Secondary | ICD-10-CM | POA: Diagnosis not present

## 2022-12-20 DIAGNOSIS — K080 Exfoliation of teeth due to systemic causes: Secondary | ICD-10-CM | POA: Diagnosis not present

## 2023-04-10 DIAGNOSIS — M1712 Unilateral primary osteoarthritis, left knee: Secondary | ICD-10-CM | POA: Diagnosis not present

## 2023-04-10 DIAGNOSIS — M48062 Spinal stenosis, lumbar region with neurogenic claudication: Secondary | ICD-10-CM | POA: Diagnosis not present

## 2023-04-10 DIAGNOSIS — M5451 Vertebrogenic low back pain: Secondary | ICD-10-CM | POA: Diagnosis not present

## 2023-04-10 DIAGNOSIS — M25562 Pain in left knee: Secondary | ICD-10-CM | POA: Diagnosis not present

## 2023-05-01 DIAGNOSIS — M5127 Other intervertebral disc displacement, lumbosacral region: Secondary | ICD-10-CM | POA: Diagnosis not present

## 2023-05-01 DIAGNOSIS — M48061 Spinal stenosis, lumbar region without neurogenic claudication: Secondary | ICD-10-CM | POA: Diagnosis not present

## 2023-05-01 DIAGNOSIS — M47817 Spondylosis without myelopathy or radiculopathy, lumbosacral region: Secondary | ICD-10-CM | POA: Diagnosis not present

## 2023-05-01 DIAGNOSIS — M545 Low back pain, unspecified: Secondary | ICD-10-CM | POA: Diagnosis not present

## 2023-05-01 DIAGNOSIS — M4316 Spondylolisthesis, lumbar region: Secondary | ICD-10-CM | POA: Diagnosis not present

## 2023-05-04 DIAGNOSIS — M48062 Spinal stenosis, lumbar region with neurogenic claudication: Secondary | ICD-10-CM | POA: Diagnosis not present

## 2023-05-10 DIAGNOSIS — H34832 Tributary (branch) retinal vein occlusion, left eye, with macular edema: Secondary | ICD-10-CM | POA: Diagnosis not present

## 2023-05-10 DIAGNOSIS — H2513 Age-related nuclear cataract, bilateral: Secondary | ICD-10-CM | POA: Diagnosis not present

## 2023-05-10 DIAGNOSIS — H401132 Primary open-angle glaucoma, bilateral, moderate stage: Secondary | ICD-10-CM | POA: Diagnosis not present

## 2023-05-10 DIAGNOSIS — H25013 Cortical age-related cataract, bilateral: Secondary | ICD-10-CM | POA: Diagnosis not present

## 2023-05-10 DIAGNOSIS — H35371 Puckering of macula, right eye: Secondary | ICD-10-CM | POA: Diagnosis not present

## 2023-05-15 DIAGNOSIS — M19079 Primary osteoarthritis, unspecified ankle and foot: Secondary | ICD-10-CM | POA: Diagnosis not present

## 2023-06-01 DIAGNOSIS — M25531 Pain in right wrist: Secondary | ICD-10-CM | POA: Diagnosis not present

## 2023-06-01 DIAGNOSIS — H34832 Tributary (branch) retinal vein occlusion, left eye, with macular edema: Secondary | ICD-10-CM | POA: Diagnosis not present

## 2023-06-01 DIAGNOSIS — H3581 Retinal edema: Secondary | ICD-10-CM | POA: Diagnosis not present

## 2023-06-01 DIAGNOSIS — M778 Other enthesopathies, not elsewhere classified: Secondary | ICD-10-CM | POA: Diagnosis not present

## 2023-06-07 DIAGNOSIS — M19031 Primary osteoarthritis, right wrist: Secondary | ICD-10-CM | POA: Diagnosis not present

## 2023-06-07 DIAGNOSIS — M25531 Pain in right wrist: Secondary | ICD-10-CM | POA: Diagnosis not present

## 2023-06-07 DIAGNOSIS — M6638 Spontaneous rupture of flexor tendons, other site: Secondary | ICD-10-CM | POA: Diagnosis not present

## 2023-06-08 DIAGNOSIS — H34832 Tributary (branch) retinal vein occlusion, left eye, with macular edema: Secondary | ICD-10-CM | POA: Diagnosis not present

## 2023-06-08 DIAGNOSIS — H3581 Retinal edema: Secondary | ICD-10-CM | POA: Diagnosis not present

## 2023-06-12 DIAGNOSIS — M25531 Pain in right wrist: Secondary | ICD-10-CM | POA: Diagnosis not present

## 2023-06-12 DIAGNOSIS — M778 Other enthesopathies, not elsewhere classified: Secondary | ICD-10-CM | POA: Diagnosis not present

## 2023-06-19 DIAGNOSIS — H348192 Central retinal vein occlusion, unspecified eye, stable: Secondary | ICD-10-CM | POA: Diagnosis not present

## 2023-06-22 DIAGNOSIS — H34812 Central retinal vein occlusion, left eye, with macular edema: Secondary | ICD-10-CM | POA: Diagnosis not present

## 2023-06-22 DIAGNOSIS — K080 Exfoliation of teeth due to systemic causes: Secondary | ICD-10-CM | POA: Diagnosis not present

## 2023-06-23 DIAGNOSIS — M545 Low back pain, unspecified: Secondary | ICD-10-CM | POA: Diagnosis not present

## 2023-07-04 DIAGNOSIS — M19071 Primary osteoarthritis, right ankle and foot: Secondary | ICD-10-CM | POA: Diagnosis not present

## 2023-07-04 DIAGNOSIS — M25571 Pain in right ankle and joints of right foot: Secondary | ICD-10-CM | POA: Diagnosis not present

## 2023-07-04 DIAGNOSIS — M12531 Traumatic arthropathy, right wrist: Secondary | ICD-10-CM | POA: Diagnosis not present

## 2023-07-04 DIAGNOSIS — M19072 Primary osteoarthritis, left ankle and foot: Secondary | ICD-10-CM | POA: Diagnosis not present

## 2023-08-03 DIAGNOSIS — Z1231 Encounter for screening mammogram for malignant neoplasm of breast: Secondary | ICD-10-CM | POA: Diagnosis not present

## 2023-08-03 DIAGNOSIS — Z01419 Encounter for gynecological examination (general) (routine) without abnormal findings: Secondary | ICD-10-CM | POA: Diagnosis not present

## 2023-08-17 DIAGNOSIS — H34832 Tributary (branch) retinal vein occlusion, left eye, with macular edema: Secondary | ICD-10-CM | POA: Diagnosis not present

## 2023-08-17 DIAGNOSIS — H3581 Retinal edema: Secondary | ICD-10-CM | POA: Diagnosis not present

## 2023-09-11 DIAGNOSIS — N8110 Cystocele, unspecified: Secondary | ICD-10-CM | POA: Diagnosis not present

## 2023-09-11 DIAGNOSIS — N952 Postmenopausal atrophic vaginitis: Secondary | ICD-10-CM | POA: Diagnosis not present

## 2023-10-05 DIAGNOSIS — H34832 Tributary (branch) retinal vein occlusion, left eye, with macular edema: Secondary | ICD-10-CM | POA: Diagnosis not present

## 2023-10-05 DIAGNOSIS — H3581 Retinal edema: Secondary | ICD-10-CM | POA: Diagnosis not present

## 2023-10-25 DIAGNOSIS — R350 Frequency of micturition: Secondary | ICD-10-CM | POA: Diagnosis not present

## 2023-10-25 DIAGNOSIS — N8110 Cystocele, unspecified: Secondary | ICD-10-CM | POA: Diagnosis not present

## 2023-10-25 DIAGNOSIS — N952 Postmenopausal atrophic vaginitis: Secondary | ICD-10-CM | POA: Diagnosis not present

## 2023-11-21 DIAGNOSIS — N329 Bladder disorder, unspecified: Secondary | ICD-10-CM | POA: Diagnosis not present

## 2023-11-21 DIAGNOSIS — B9629 Other Escherichia coli [E. coli] as the cause of diseases classified elsewhere: Secondary | ICD-10-CM | POA: Diagnosis not present

## 2023-11-21 DIAGNOSIS — N390 Urinary tract infection, site not specified: Secondary | ICD-10-CM | POA: Diagnosis not present

## 2023-11-21 DIAGNOSIS — N952 Postmenopausal atrophic vaginitis: Secondary | ICD-10-CM | POA: Diagnosis not present

## 2023-11-21 DIAGNOSIS — R339 Retention of urine, unspecified: Secondary | ICD-10-CM | POA: Diagnosis not present

## 2023-11-21 DIAGNOSIS — N8189 Other female genital prolapse: Secondary | ICD-10-CM | POA: Diagnosis not present

## 2023-11-30 DIAGNOSIS — H34832 Tributary (branch) retinal vein occlusion, left eye, with macular edema: Secondary | ICD-10-CM | POA: Diagnosis not present

## 2023-11-30 DIAGNOSIS — B9620 Unspecified Escherichia coli [E. coli] as the cause of diseases classified elsewhere: Secondary | ICD-10-CM | POA: Diagnosis not present

## 2023-11-30 DIAGNOSIS — H3581 Retinal edema: Secondary | ICD-10-CM | POA: Diagnosis not present

## 2023-11-30 DIAGNOSIS — N390 Urinary tract infection, site not specified: Secondary | ICD-10-CM | POA: Diagnosis not present

## 2023-12-11 DIAGNOSIS — Z1331 Encounter for screening for depression: Secondary | ICD-10-CM | POA: Diagnosis not present

## 2023-12-11 DIAGNOSIS — Z0000 Encounter for general adult medical examination without abnormal findings: Secondary | ICD-10-CM | POA: Diagnosis not present

## 2023-12-11 DIAGNOSIS — M159 Polyosteoarthritis, unspecified: Secondary | ICD-10-CM | POA: Diagnosis not present

## 2023-12-11 DIAGNOSIS — Z1339 Encounter for screening examination for other mental health and behavioral disorders: Secondary | ICD-10-CM | POA: Diagnosis not present

## 2023-12-11 DIAGNOSIS — Z23 Encounter for immunization: Secondary | ICD-10-CM | POA: Diagnosis not present

## 2023-12-11 DIAGNOSIS — R82998 Other abnormal findings in urine: Secondary | ICD-10-CM | POA: Diagnosis not present

## 2023-12-18 DIAGNOSIS — Z0000 Encounter for general adult medical examination without abnormal findings: Secondary | ICD-10-CM | POA: Diagnosis not present

## 2023-12-18 DIAGNOSIS — Z1389 Encounter for screening for other disorder: Secondary | ICD-10-CM | POA: Diagnosis not present

## 2024-01-22 DIAGNOSIS — N393 Stress incontinence (female) (male): Secondary | ICD-10-CM | POA: Diagnosis not present

## 2024-01-22 DIAGNOSIS — N8110 Cystocele, unspecified: Secondary | ICD-10-CM | POA: Diagnosis not present

## 2024-01-22 DIAGNOSIS — Z01812 Encounter for preprocedural laboratory examination: Secondary | ICD-10-CM | POA: Diagnosis not present

## 2024-01-22 DIAGNOSIS — Z01818 Encounter for other preprocedural examination: Secondary | ICD-10-CM | POA: Diagnosis not present

## 2024-02-01 DIAGNOSIS — H34832 Tributary (branch) retinal vein occlusion, left eye, with macular edema: Secondary | ICD-10-CM | POA: Diagnosis not present

## 2024-02-01 DIAGNOSIS — H3581 Retinal edema: Secondary | ICD-10-CM | POA: Diagnosis not present

## 2024-02-08 DIAGNOSIS — N858 Other specified noninflammatory disorders of uterus: Secondary | ICD-10-CM | POA: Diagnosis not present

## 2024-02-08 DIAGNOSIS — R339 Retention of urine, unspecified: Secondary | ICD-10-CM | POA: Diagnosis not present

## 2024-02-08 DIAGNOSIS — N8110 Cystocele, unspecified: Secondary | ICD-10-CM | POA: Diagnosis not present

## 2024-02-08 DIAGNOSIS — N952 Postmenopausal atrophic vaginitis: Secondary | ICD-10-CM | POA: Diagnosis not present

## 2024-02-08 DIAGNOSIS — R234 Changes in skin texture: Secondary | ICD-10-CM | POA: Diagnosis not present

## 2024-02-08 DIAGNOSIS — N888 Other specified noninflammatory disorders of cervix uteri: Secondary | ICD-10-CM | POA: Diagnosis not present

## 2024-02-09 DIAGNOSIS — R234 Changes in skin texture: Secondary | ICD-10-CM | POA: Diagnosis not present

## 2024-02-09 DIAGNOSIS — N8110 Cystocele, unspecified: Secondary | ICD-10-CM | POA: Diagnosis not present

## 2024-02-09 DIAGNOSIS — N888 Other specified noninflammatory disorders of cervix uteri: Secondary | ICD-10-CM | POA: Diagnosis not present

## 2024-02-09 DIAGNOSIS — R339 Retention of urine, unspecified: Secondary | ICD-10-CM | POA: Diagnosis not present

## 2024-02-09 DIAGNOSIS — N952 Postmenopausal atrophic vaginitis: Secondary | ICD-10-CM | POA: Diagnosis not present

## 2024-02-23 DIAGNOSIS — R32 Unspecified urinary incontinence: Secondary | ICD-10-CM | POA: Diagnosis not present

## 2024-02-23 DIAGNOSIS — N390 Urinary tract infection, site not specified: Secondary | ICD-10-CM | POA: Diagnosis not present

## 2024-02-23 DIAGNOSIS — N8189 Other female genital prolapse: Secondary | ICD-10-CM | POA: Diagnosis not present

## 2024-02-23 DIAGNOSIS — B961 Klebsiella pneumoniae [K. pneumoniae] as the cause of diseases classified elsewhere: Secondary | ICD-10-CM | POA: Diagnosis not present

## 2024-03-08 DIAGNOSIS — N8110 Cystocele, unspecified: Secondary | ICD-10-CM | POA: Diagnosis not present

## 2024-03-14 DIAGNOSIS — H2513 Age-related nuclear cataract, bilateral: Secondary | ICD-10-CM | POA: Diagnosis not present

## 2024-03-14 DIAGNOSIS — H25013 Cortical age-related cataract, bilateral: Secondary | ICD-10-CM | POA: Diagnosis not present

## 2024-03-14 DIAGNOSIS — H401132 Primary open-angle glaucoma, bilateral, moderate stage: Secondary | ICD-10-CM | POA: Diagnosis not present

## 2024-03-14 DIAGNOSIS — H35371 Puckering of macula, right eye: Secondary | ICD-10-CM | POA: Diagnosis not present

## 2024-03-28 DIAGNOSIS — R32 Unspecified urinary incontinence: Secondary | ICD-10-CM | POA: Diagnosis not present

## 2024-04-04 DIAGNOSIS — H25012 Cortical age-related cataract, left eye: Secondary | ICD-10-CM | POA: Diagnosis not present

## 2024-04-11 DIAGNOSIS — H3581 Retinal edema: Secondary | ICD-10-CM | POA: Diagnosis not present

## 2024-04-11 DIAGNOSIS — H34832 Tributary (branch) retinal vein occlusion, left eye, with macular edema: Secondary | ICD-10-CM | POA: Diagnosis not present

## 2024-05-03 DIAGNOSIS — H25012 Cortical age-related cataract, left eye: Secondary | ICD-10-CM | POA: Diagnosis not present

## 2024-05-03 DIAGNOSIS — H401122 Primary open-angle glaucoma, left eye, moderate stage: Secondary | ICD-10-CM | POA: Diagnosis not present

## 2024-05-03 DIAGNOSIS — H2512 Age-related nuclear cataract, left eye: Secondary | ICD-10-CM | POA: Diagnosis not present

## 2024-05-20 DIAGNOSIS — K080 Exfoliation of teeth due to systemic causes: Secondary | ICD-10-CM | POA: Diagnosis not present

## 2024-06-03 DIAGNOSIS — R32 Unspecified urinary incontinence: Secondary | ICD-10-CM | POA: Diagnosis not present

## 2024-06-03 DIAGNOSIS — H34812 Central retinal vein occlusion, left eye, with macular edema: Secondary | ICD-10-CM | POA: Diagnosis not present

## 2024-06-06 DIAGNOSIS — H3581 Retinal edema: Secondary | ICD-10-CM | POA: Diagnosis not present

## 2024-06-06 DIAGNOSIS — H34812 Central retinal vein occlusion, left eye, with macular edema: Secondary | ICD-10-CM | POA: Diagnosis not present

## 2024-06-10 DIAGNOSIS — H35352 Cystoid macular degeneration, left eye: Secondary | ICD-10-CM | POA: Diagnosis not present

## 2024-06-10 DIAGNOSIS — H34832 Tributary (branch) retinal vein occlusion, left eye, with macular edema: Secondary | ICD-10-CM | POA: Diagnosis not present

## 2024-06-27 DIAGNOSIS — M1712 Unilateral primary osteoarthritis, left knee: Secondary | ICD-10-CM | POA: Diagnosis not present

## 2024-07-01 DIAGNOSIS — H35352 Cystoid macular degeneration, left eye: Secondary | ICD-10-CM | POA: Diagnosis not present

## 2024-07-01 DIAGNOSIS — H40013 Open angle with borderline findings, low risk, bilateral: Secondary | ICD-10-CM | POA: Diagnosis not present

## 2024-07-01 DIAGNOSIS — H34832 Tributary (branch) retinal vein occlusion, left eye, with macular edema: Secondary | ICD-10-CM | POA: Diagnosis not present

## 2024-07-18 DIAGNOSIS — Z87440 Personal history of urinary (tract) infections: Secondary | ICD-10-CM | POA: Diagnosis not present

## 2024-07-18 DIAGNOSIS — Z6831 Body mass index (BMI) 31.0-31.9, adult: Secondary | ICD-10-CM | POA: Diagnosis not present

## 2024-07-18 DIAGNOSIS — R32 Unspecified urinary incontinence: Secondary | ICD-10-CM | POA: Diagnosis not present

## 2024-07-18 DIAGNOSIS — N3946 Mixed incontinence: Secondary | ICD-10-CM | POA: Diagnosis not present

## 2024-07-18 DIAGNOSIS — N952 Postmenopausal atrophic vaginitis: Secondary | ICD-10-CM | POA: Diagnosis not present

## 2024-07-19 DIAGNOSIS — H52202 Unspecified astigmatism, left eye: Secondary | ICD-10-CM | POA: Diagnosis not present

## 2024-07-19 DIAGNOSIS — H524 Presbyopia: Secondary | ICD-10-CM | POA: Diagnosis not present

## 2024-07-19 DIAGNOSIS — H5212 Myopia, left eye: Secondary | ICD-10-CM | POA: Diagnosis not present

## 2024-08-26 DIAGNOSIS — H34832 Tributary (branch) retinal vein occlusion, left eye, with macular edema: Secondary | ICD-10-CM | POA: Diagnosis not present

## 2024-08-26 DIAGNOSIS — H5212 Myopia, left eye: Secondary | ICD-10-CM | POA: Diagnosis not present

## 2024-08-26 DIAGNOSIS — H35352 Cystoid macular degeneration, left eye: Secondary | ICD-10-CM | POA: Diagnosis not present

## 2024-08-26 DIAGNOSIS — H52202 Unspecified astigmatism, left eye: Secondary | ICD-10-CM | POA: Diagnosis not present

## 2024-09-24 DIAGNOSIS — M1712 Unilateral primary osteoarthritis, left knee: Secondary | ICD-10-CM | POA: Diagnosis not present

## 2024-09-30 DIAGNOSIS — H34832 Tributary (branch) retinal vein occlusion, left eye, with macular edema: Secondary | ICD-10-CM | POA: Diagnosis not present

## 2024-09-30 DIAGNOSIS — H401132 Primary open-angle glaucoma, bilateral, moderate stage: Secondary | ICD-10-CM | POA: Diagnosis not present

## 2024-10-19 ENCOUNTER — Encounter: Payer: Self-pay | Admitting: Internal Medicine
